# Patient Record
Sex: Female | Born: 1990 | Race: White | Hispanic: No | Smoking: Current some day smoker
Health system: Southern US, Community
[De-identification: ages and names within clinical notes are randomized; demographics above are authoritative.]

## PROBLEM LIST (undated history)

## (undated) ENCOUNTER — Inpatient Hospital Stay (HOSPITAL_COMMUNITY): Payer: Self-pay

## (undated) DIAGNOSIS — K8020 Calculus of gallbladder without cholecystitis without obstruction: Secondary | ICD-10-CM

## (undated) DIAGNOSIS — J45909 Unspecified asthma, uncomplicated: Secondary | ICD-10-CM

## (undated) DIAGNOSIS — F419 Anxiety disorder, unspecified: Secondary | ICD-10-CM

## (undated) DIAGNOSIS — F909 Attention-deficit hyperactivity disorder, unspecified type: Secondary | ICD-10-CM

## (undated) DIAGNOSIS — K219 Gastro-esophageal reflux disease without esophagitis: Secondary | ICD-10-CM

## (undated) DIAGNOSIS — F32A Depression, unspecified: Secondary | ICD-10-CM

## (undated) DIAGNOSIS — I10 Essential (primary) hypertension: Secondary | ICD-10-CM

## (undated) DIAGNOSIS — F329 Major depressive disorder, single episode, unspecified: Secondary | ICD-10-CM

## (undated) HISTORY — PX: TONSILLECTOMY: SUR1361

## (undated) HISTORY — PX: WISDOM TOOTH EXTRACTION: SHX21

---
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
## 2011-01-05 ENCOUNTER — Emergency Department (HOSPITAL_COMMUNITY)
Admission: EM | Admit: 2011-01-05 | Discharge: 2011-01-05 | Disposition: A | Discharge status: Home / Self Care | Payer: Medicaid Other | Attending: Emergency Medicine | Admitting: Emergency Medicine

## 2011-01-05 DIAGNOSIS — J029 Acute pharyngitis, unspecified: Secondary | ICD-10-CM | POA: Insufficient documentation

## 2011-01-05 DIAGNOSIS — F17200 Nicotine dependence, unspecified, uncomplicated: Secondary | ICD-10-CM | POA: Insufficient documentation

## 2011-01-05 DIAGNOSIS — R220 Localized swelling, mass and lump, head: Secondary | ICD-10-CM | POA: Insufficient documentation

## 2011-01-05 LAB — RAPID STREP SCREEN (MED CTR MEBANE ONLY): Streptococcus, Group A Screen (Direct): NEGATIVE

## 2011-01-05 MED ORDER — IBUPROFEN 600 MG PO TABS: 600.0000 mg | ORAL_TABLET | Freq: Four times a day (QID) | ORAL | Status: AC | PRN

## 2011-01-05 MED ORDER — IBUPROFEN 200 MG PO TABS
600.0000 mg | ORAL_TABLET | Freq: Once | ORAL | Status: DC
  Filled 2011-01-05: qty 3

## 2011-01-05 NOTE — ED Notes (Signed)
Pt presents with 1 month h/o difficulty swallowing.  Pt reports glands are swollen, -fever

## 2011-01-05 NOTE — ED Provider Notes (Signed)
History     CSN: [id number]  Arrival date & time 01/05/11  4098   First MD Initiated Contact with Patient 01/05/11 2151      Chief Complaint  Patient presents with  . Sore Throat    (Consider location/radiation/quality/duration/timing/severity/associated sxs/prior treatment) HPI history from patient Patient making a female presents with sore throat. It has been present on and off for one month. It is mild to moderate in severity. It is worse on the right side of the upper present bilaterally. She has had no cough. She had mild subjective fever at one point but is afebrile now. She is tolerating by mouth well and having no respiratory complaints. She feels like her neck may be mildly swollen. She has full range of motion of her neck without trouble. Overall severity mild to moderate. Patient states that she came here today because she wants her tonsils removed.  History reviewed. No pertinent past medical history.  History reviewed. No pertinent past surgical history.  History reviewed. No pertinent family history.  History  Substance Use Topics  . Smoking status: Current Everyday Smoker -- 0.5 packs/day  . Smokeless tobacco: Not on file  . Alcohol Use: No    OB History    Grav Para Term Preterm Abortions TAB SAB Ect Mult Living                  Review of Systems  Constitutional: Negative for fever and chills.  HENT: Negative for facial swelling, neck pain and neck stiffness.   Eyes: Negative for visual disturbance.  Respiratory: Negative for cough, chest tightness, shortness of breath and wheezing.   Cardiovascular: Negative for chest pain.  Gastrointestinal: Negative for nausea, vomiting, abdominal pain and diarrhea.  Genitourinary: Negative for difficulty urinating.  Skin: Negative for rash.  Neurological: Negative for weakness and numbness.  Psychiatric/Behavioral: Negative for behavioral problems and confusion.  All other systems reviewed and are  negative.    Allergies  Review of patient's allergies indicates no known allergies.  Home Medications   Current Outpatient Rx  Name Route Sig Dispense Refill  . ACETAMINOPHEN 500 MG PO TABS Oral Take 1,000 mg by mouth every 6 (six) hours as needed. For pain     . IBUPROFEN 600 MG PO TABS Oral Take 1 tablet (600 mg total) by mouth every 6 (six) hours as needed for pain. 30 tablet 0    BP 124/81  Pulse 110  Temp(Src) 97.7 F (36.5 C) (Oral)  Resp 18  Ht 5\' 2"  (1.575 m)  Wt 135 lb (61.236 kg)  BMI 24.69 kg/m2  SpO2 98%  LMP 11/05/2010  Physical Exam  Nursing note and vitals reviewed. Constitutional: She is oriented to person, place, and time. She appears well-developed and well-nourished. No distress.  HENT:  Head: Normocephalic.  Nose: Nose normal.  Mouth/Throat: Oropharynx is clear and moist.       Minimal pharyngeal erythema. Tonsillar hypertrophy right greater than left. Patent airway. No significant exudate. No significant lymphadenopathy. Full neck range of motion. No soft palate asymmetry.  Eyes: EOM are normal. Pupils are equal, round, and reactive to light. No scleral icterus.  Neck: Normal range of motion. Neck supple.       No bruits  Cardiovascular: Normal rate, regular rhythm and intact distal pulses.   Pulmonary/Chest: Effort normal and breath sounds normal. No respiratory distress. She has no wheezes.  Abdominal: Soft. She exhibits no distension.  Musculoskeletal: Normal range of motion. She exhibits no edema and no  tenderness.  Neurological: She is alert and oriented to person, place, and time. She has normal strength. No cranial nerve deficit or sensory deficit. She exhibits normal muscle tone. Coordination normal. GCS eye subscore is 4. GCS verbal subscore is 5. GCS motor subscore is 6.       No pronator drift  Skin: Skin is warm and dry. No rash noted. She is not diaphoretic.  Psychiatric: She has a normal mood and affect. Her behavior is normal. Thought  content normal.    ED Course  Procedures (including critical care time)   Labs Reviewed  RAPID STREP SCREEN   No results found.   1. Acute viral pharyngitis       MDM   Patient here with intermittent sore throat over the last month. She is here explicitly requesting tonsillectomy. She states that a doctor yesterday told her she had strep throat but did not prescribe antibiotics. Rapid strep. Negative. Patient does have tonsillar hypertrophy but no airway compromise. There is also no evidence of deep space infection. Patient is well-appearing. I provided ENT followup information. Return cautions discussed        Milus Glazier 01/06/11 1610

## 2011-01-05 NOTE — ED Notes (Signed)
Pt declined ibuprofen. States she has ibuprofen in her car. States "I just want my discharge papers."

## 2011-01-11 ENCOUNTER — Other Ambulatory Visit: Payer: Self-pay | Admitting: Otolaryngology

## 2011-01-13 NOTE — ED Provider Notes (Signed)
I saw and evaluated the patient, reviewed the resident's note and I agree with the findings and plan.  Pt with sore throat. No evidence of deep space infection on exam. Plan symptomatic tx. Pt requesting ENT f/u and provided.    Raeford Razor, MD 01/13/11 [id number]

## 2011-12-21 ENCOUNTER — Emergency Department (HOSPITAL_COMMUNITY)
Admission: EM | Admit: 2011-12-21 | Discharge: 2011-12-21 | Disposition: A | Discharge status: Home / Self Care | Payer: Self-pay | Attending: Emergency Medicine | Admitting: Emergency Medicine

## 2011-12-21 ENCOUNTER — Emergency Department (HOSPITAL_COMMUNITY): Payer: Self-pay

## 2011-12-21 ENCOUNTER — Encounter (HOSPITAL_COMMUNITY): Payer: Self-pay | Admitting: Emergency Medicine

## 2011-12-21 DIAGNOSIS — N760 Acute vaginitis: Secondary | ICD-10-CM

## 2011-12-21 DIAGNOSIS — N739 Female pelvic inflammatory disease, unspecified: Secondary | ICD-10-CM | POA: Insufficient documentation

## 2011-12-21 DIAGNOSIS — N730 Acute parametritis and pelvic cellulitis: Secondary | ICD-10-CM

## 2011-12-21 DIAGNOSIS — R630 Anorexia: Secondary | ICD-10-CM | POA: Insufficient documentation

## 2011-12-21 DIAGNOSIS — F17200 Nicotine dependence, unspecified, uncomplicated: Secondary | ICD-10-CM | POA: Insufficient documentation

## 2011-12-21 DIAGNOSIS — R197 Diarrhea, unspecified: Secondary | ICD-10-CM | POA: Insufficient documentation

## 2011-12-21 DIAGNOSIS — Z3202 Encounter for pregnancy test, result negative: Secondary | ICD-10-CM | POA: Insufficient documentation

## 2011-12-21 DIAGNOSIS — R112 Nausea with vomiting, unspecified: Secondary | ICD-10-CM | POA: Insufficient documentation

## 2011-12-21 LAB — CBC WITH DIFFERENTIAL/PLATELET
Basophils Absolute: 0 10*3/uL (ref 0.0–0.1)
Basophils Relative: 0 % (ref 0–1)
Eosinophils Absolute: 0.1 10*3/uL (ref 0.0–0.7)
HCT: 39.3 % (ref 36.0–46.0)
MCH: 28.5 pg (ref 26.0–34.0)
MCHC: 33.6 g/dL (ref 30.0–36.0)
Monocytes Absolute: 0.6 10*3/uL (ref 0.1–1.0)
Neutro Abs: 4 10*3/uL (ref 1.7–7.7)
RDW: 12.4 % (ref 11.5–15.5)

## 2011-12-21 LAB — URINALYSIS, ROUTINE W REFLEX MICROSCOPIC
Protein, ur: NEGATIVE mg/dL
Urobilinogen, UA: 0.2 mg/dL (ref 0.0–1.0)

## 2011-12-21 LAB — BASIC METABOLIC PANEL
Calcium: 8.9 mg/dL (ref 8.4–10.5)
Chloride: 107 mEq/L (ref 96–112)
Creatinine, Ser: 0.58 mg/dL (ref 0.50–1.10)
GFR calc Af Amer: >90 mL/min (ref >90)
GFR calc non Af Amer: >90 mL/min (ref >90)

## 2011-12-21 LAB — POCT PREGNANCY, URINE: Preg Test, Ur: NEGATIVE

## 2011-12-21 LAB — WET PREP, GENITAL: Yeast Wet Prep HPF POC: NONE SEEN

## 2011-12-21 LAB — URINE MICROSCOPIC-ADD ON

## 2011-12-21 MED ORDER — ONDANSETRON HCL 4 MG/2ML IJ SOLN
4.0000 mg | Freq: Once | INTRAMUSCULAR | Status: AC
  Administered 2011-12-21: 4 mg via INTRAVENOUS
  Filled 2011-12-21: qty 2

## 2011-12-21 MED ORDER — CEFTRIAXONE SODIUM 250 MG IJ SOLR
250.0000 mg | Freq: Once | INTRAMUSCULAR | Status: DC
  Filled 2011-12-21: qty 250

## 2011-12-21 MED ORDER — AZITHROMYCIN 250 MG PO TABS
1000.0000 mg | ORAL_TABLET | Freq: Once | ORAL | Status: AC
  Administered 2011-12-21: 1000 mg via ORAL

## 2011-12-21 MED ORDER — AZITHROMYCIN 250 MG PO TABS
1000.0000 mg | ORAL_TABLET | Freq: Once | ORAL | Status: DC
  Filled 2011-12-21: qty 4

## 2011-12-21 MED ORDER — CEFTRIAXONE SODIUM 250 MG IJ SOLR
250.0000 mg | Freq: Once | INTRAMUSCULAR | Status: AC
  Administered 2011-12-21: 250 mg via INTRAMUSCULAR

## 2011-12-21 MED ORDER — MORPHINE SULFATE 4 MG/ML IJ SOLN
4.0000 mg | Freq: Once | INTRAMUSCULAR | Status: AC
  Administered 2011-12-21: 4 mg via INTRAVENOUS
  Filled 2011-12-21: qty 1

## 2011-12-21 MED ORDER — HYDROCODONE-ACETAMINOPHEN 5-325 MG PO TABS: 2.0000 | ORAL_TABLET | ORAL | Status: DC | PRN

## 2011-12-21 MED ORDER — METRONIDAZOLE 500 MG PO TABS: 500.0000 mg | ORAL_TABLET | Freq: Two times a day (BID) | ORAL | Status: DC

## 2011-12-21 MED ORDER — IOHEXOL 300 MG/ML  SOLN
20.0000 mL | INTRAMUSCULAR | Status: AC
  Administered 2011-12-21: 20 mL via ORAL

## 2011-12-21 MED ORDER — SODIUM CHLORIDE 0.9 % IV BOLUS (SEPSIS)
1000.0000 mL | Freq: Once | INTRAVENOUS | Status: AC
  Administered 2011-12-21: 1000 mL via INTRAVENOUS

## 2011-12-21 MED ORDER — LIDOCAINE HCL (PF) 1 % IJ SOLN
INTRAMUSCULAR | Status: AC
  Administered 2011-12-21: 1 mL
  Filled 2011-12-21: qty 5

## 2011-12-21 MED ORDER — METRONIDAZOLE 500 MG PO TABS
500.0000 mg | ORAL_TABLET | Freq: Once | ORAL | Status: AC
  Administered 2011-12-21: 500 mg via ORAL
  Filled 2011-12-21: qty 1

## 2011-12-21 MED ORDER — DOXYCYCLINE HYCLATE 100 MG PO CAPS: 100.0000 mg | ORAL_CAPSULE | Freq: Two times a day (BID) | ORAL | Status: DC

## 2011-12-21 MED ORDER — HYDROMORPHONE HCL PF 1 MG/ML IJ SOLN
0.5000 mg | Freq: Once | INTRAMUSCULAR | Status: AC
  Administered 2011-12-21: 0.5 mg via INTRAVENOUS
  Filled 2011-12-21: qty 1

## 2011-12-21 MED ORDER — IOHEXOL 300 MG/ML  SOLN
80.0000 mL | Freq: Once | INTRAMUSCULAR | Status: AC | PRN
  Administered 2011-12-21: 80 mL via INTRAVENOUS

## 2011-12-21 NOTE — ED Provider Notes (Signed)
History     CSN: [id number]  Arrival date & time 12/21/11  1527   First MD Initiated Contact with Patient 12/21/11 1806      Chief Complaint  Patient presents with  . Abdominal Pain    (Consider location/radiation/quality/duration/timing/severity/associated sxs/prior treatment) HPI  21 year old female with no significant medical history presents complaining of abdominal pain. Patient reports she developed acute onset of sharp stabbing pain to the periumbilical region this a.m. pain is initially intense, rated at 10 out of 10 and has improved to 4/10 throughout the day after taking Tylenol. Patient also endorsed for nausea, vomit twice a food product and has had 2 bouts of nonbloody non-mucousy diarrhea. Pain is worsened with palpation, and nonradiating. Chest decreased appetite. She denies fever, chills, chest pain, shortness of breath, back pain, dysuria, hematuria, vaginal discharge, or rash. No prior abdominal surgery. Appendix is intact. Patient has Mirena IUD, unable to recall her last menstruation.  Pt is sexually active with boyfriend, no protection.    History reviewed. No pertinent past medical history.  History reviewed. No pertinent past surgical history.  History reviewed. No pertinent family history.  History  Substance Use Topics  . Smoking status: Current Every Day Smoker -- 0.5 packs/day  . Smokeless tobacco: Not on file  . Alcohol Use: No    OB History    Grav Para Term Preterm Abortions TAB SAB Ect Mult Living                  Review of Systems  Constitutional: Negative for fever.  Respiratory: Negative for chest tightness and shortness of breath.   Cardiovascular: Negative for chest pain.  Gastrointestinal: Positive for nausea, vomiting, abdominal pain and diarrhea.    Allergies  Review of patient's allergies indicates no known allergies.  Home Medications   Current Outpatient Rx  Name  Route  Sig  Dispense  Refill  . ACETAMINOPHEN 500 MG PO  TABS   Oral   Take 1,000 mg by mouth every 6 (six) hours as needed. For pain            BP 112/61  Pulse 82  Temp 98 F (36.7 C) (Oral)  Resp 16  SpO2 98%  Physical Exam  Nursing note and vitals reviewed. Constitutional: She is oriented to person, place, and time. She appears well-developed and well-nourished. No distress.  HENT:  Head: Normocephalic and atraumatic.  Mouth/Throat: Oropharynx is clear and moist.       Poor dentition  Eyes: Conjunctivae normal are normal.  Neck: Normal range of motion. Neck supple.  Cardiovascular: Normal rate and regular rhythm.  Exam reveals no gallop and no friction rub.   No murmur heard. Pulmonary/Chest: Effort normal. No respiratory distress. She exhibits no tenderness.  Abdominal: Soft. She exhibits no mass. There is tenderness (Diffuse abdominal tenderness, most significant at the periumbilical region without guarding or rebound tenderness). There is no rebound and no guarding.  Genitourinary: Vaginal discharge found.       Chaperone present:  Moderate vaginal discharge in vaginal vault.  Unable to visualized cervical os despite multiple attempt to reposition speculum.  Pt has L and R adnexal tenderness without fullness.  No fb seen or palpated  Musculoskeletal:       Right arm stump, with congenital deformity  Neurological: She is alert and oriented to person, place, and time.    ED Course  Procedures (including critical care time)  Labs Reviewed  URINALYSIS, ROUTINE W REFLEX MICROSCOPIC - Abnormal; Notable  for the following:    APPearance CLOUDY (*)     Hgb urine dipstick SMALL (*)     Leukocytes, UA LARGE (*)     All other components within normal limits  URINE MICROSCOPIC-ADD ON - Abnormal; Notable for the following:    Squamous Epithelial / LPF MANY (*)     Bacteria, UA FEW (*)     All other components within normal limits  POCT PREGNANCY, URINE  URINE CULTURE   Results for orders placed during the hospital encounter of  12/21/11  URINALYSIS, ROUTINE W REFLEX MICROSCOPIC      Component Value Range   Color, Urine YELLOW  YELLOW   APPearance CLOUDY (*) CLEAR   Specific Gravity, Urine 1.019  1.005 - 1.030   pH 6.0  5.0 - 8.0   Glucose, UA NEGATIVE  NEGATIVE mg/dL   Hgb urine dipstick SMALL (*) NEGATIVE   Bilirubin Urine NEGATIVE  NEGATIVE   Ketones, ur NEGATIVE  NEGATIVE mg/dL   Protein, ur NEGATIVE  NEGATIVE mg/dL   Urobilinogen, UA 0.2  0.0 - 1.0 mg/dL   Nitrite NEGATIVE  NEGATIVE   Leukocytes, UA LARGE (*) NEGATIVE  POCT PREGNANCY, URINE      Component Value Range   Preg Test, Ur NEGATIVE  NEGATIVE  URINE MICROSCOPIC-ADD ON      Component Value Range   Squamous Epithelial / LPF MANY (*) RARE   WBC, UA 3-6  <3 WBC/hpf   RBC / HPF 3-6  <3 RBC/hpf   Bacteria, UA FEW (*) RARE   Urine-Other MUCOUS PRESENT    CBC WITH DIFFERENTIAL      Component Value Range   WBC 8.2  4.0 - 10.5 K/uL   RBC 4.63  3.87 - 5.11 MIL/uL   Hemoglobin 13.2  12.0 - 15.0 g/dL   HCT 19.1  47.8 - 29.5 %   MCV 84.9  78.0 - 100.0 fL   MCH 28.5  26.0 - 34.0 pg   MCHC 33.6  30.0 - 36.0 g/dL   RDW 62.1  30.8 - 65.7 %   Platelets 303  150 - 400 K/uL   Neutrophils Relative 49  43 - 77 %   Neutro Abs 4.0  1.7 - 7.7 K/uL   Lymphocytes Relative 42  12 - 46 %   Lymphs Abs 3.4  0.7 - 4.0 K/uL   Monocytes Relative 8  3 - 12 %   Monocytes Absolute 0.6  0.1 - 1.0 K/uL   Eosinophils Relative 1  0 - 5 %   Eosinophils Absolute 0.1  0.0 - 0.7 K/uL   Basophils Relative 0  0 - 1 %   Basophils Absolute 0.0  0.0 - 0.1 K/uL  BASIC METABOLIC PANEL      Component Value Range   Sodium 140  135 - 145 mEq/L   Potassium 4.2  3.5 - 5.1 mEq/L   Chloride 107  96 - 112 mEq/L   CO2 25  19 - 32 mEq/L   Glucose, Bld 86  70 - 99 mg/dL   BUN 12  6 - 23 mg/dL   Creatinine, Ser 8.46  0.50 - 1.10 mg/dL   Calcium 8.9  8.4 - 96.2 mg/dL   GFR calc non Af Amer >90  >90 mL/min   GFR calc Af Amer >90  >90 mL/min  WET PREP, GENITAL      Component Value  Range   Yeast Wet Prep HPF POC NONE SEEN  NONE SEEN   Trich,  Wet Prep NONE SEEN  NONE SEEN   Clue Cells Wet Prep HPF POC MODERATE (*) NONE SEEN   WBC, Wet Prep HPF POC MANY (*) NONE SEEN   US Transvaginal Non-ob  12/21/2011  *RADIOLOGY REPORT*  Clinical Data:  Pelvic pain.  Patient has intrauterine device.  TRANSABDOMINAL AND TRANSVAGINAL ULTRASOUND OF PELVIS DOPPLER ULTRASOUND OF OVARIES  Technique:  Both transabdominal and transvaginal ultrasound examinations of the pelvis were performed. Transabdominal technique was performed for global imaging of the pelvis including uterus, ovaries, adnexal regions, and pelvic cul-de-sac.  It was necessary to proceed with endovaginal exam following the transabdominal exam to visualize the ovaries and endometrium.  Color and duplex Doppler ultrasound was utilized to evaluate blood flow to the ovaries.  Comparison:  None.  Findings:  Uterus:  Normal in size and appearance.  Anteverted and measures 826 x 3.7 x 5.2 cm.  Endometrium:  Normal in thickness and appearance.  Measures 3 mm. An intrauterine device appears appropriately positioned within the endometrial canal.  Right ovary: Normal appearance/no adnexal mass.  Right ovary measures 2.9 x 2.5 x 3.2 cm.  Left ovary:   Normal appearance/no adnexal mass.  Left ovary measures 3.5 x 1.6 x 2.5 cm.  Pulsed Doppler evaluation demonstrates normal low-resistance arterial and venous waveforms in both ovaries.  There is a small amount of simple free fluid.  IMPRESSION:  1.Normal exam.  No evidence of pelvic mass or other significant abnormality.No sonographic evidence for ovarian torsion. 2.  Satisfactory position of intrauterine device.   Original Report Authenticated By: Britta Mccreedy, M.D.    US Pelvis Complete  12/21/2011  *RADIOLOGY REPORT*  Clinical Data:  Pelvic pain.  Patient has intrauterine device.  TRANSABDOMINAL AND TRANSVAGINAL ULTRASOUND OF PELVIS DOPPLER ULTRASOUND OF OVARIES  Technique:  Both transabdominal and  transvaginal ultrasound examinations of the pelvis were performed. Transabdominal technique was performed for global imaging of the pelvis including uterus, ovaries, adnexal regions, and pelvic cul-de-sac.  It was necessary to proceed with endovaginal exam following the transabdominal exam to visualize the ovaries and endometrium.  Color and duplex Doppler ultrasound was utilized to evaluate blood flow to the ovaries.  Comparison:  None.  Findings:  Uterus:  Normal in size and appearance.  Anteverted and measures 826 x 3.7 x 5.2 cm.  Endometrium:  Normal in thickness and appearance.  Measures 3 mm. An intrauterine device appears appropriately positioned within the endometrial canal.  Right ovary: Normal appearance/no adnexal mass.  Right ovary measures 2.9 x 2.5 x 3.2 cm.  Left ovary:   Normal appearance/no adnexal mass.  Left ovary measures 3.5 x 1.6 x 2.5 cm.  Pulsed Doppler evaluation demonstrates normal low-resistance arterial and venous waveforms in both ovaries.  There is a small amount of simple free fluid.  IMPRESSION:  1.Normal exam.  No evidence of pelvic mass or other significant abnormality.No sonographic evidence for ovarian torsion. 2.  Satisfactory position of intrauterine device.   Original Report Authenticated By: Britta Mccreedy, M.D.    Ct Abdomen Pelvis W Contrast  12/21/2011  *RADIOLOGY REPORT*  Clinical Data: Right lower quadrant abdominal pain  CT ABDOMEN AND PELVIS WITH CONTRAST  Technique:  Multidetector CT imaging of the abdomen and pelvis was performed following the standard protocol during bolus administration of intravenous contrast.  Contrast: 80mL OMNIPAQUE IOHEXOL 300 MG/ML  SOLN  Comparison: None.  Findings: Mild linear scarring versus atelectasis in the bilateral lung bases.  Liver, spleen, pancreas, and adrenal glands are within normal limits.  Gallbladder  is unremarkable.  No intrahepatic or extrahepatic ductal dilatation.  Kidneys are within normal limits.  No hydronephrosis.   No evidence of bowel obstruction.  Normal appendix.  No evidence of abdominal aortic aneurysm.  Small retroperitoneal lymph nodes which do not meet pathologic CT size criteria.  Uterus is notable for an IUD in satisfactory position.  Left ovary is unremarkable.  Right ovary is notable for a 1.8 cm corpus luteal cyst.  Small volume pelvic ascites, likely physiologic.  Very mild right pelvic mesenteric stranding (series 2/image 67), correlate for PID.  Bladder is within normal limits.  Visualized osseous structures are within normal limits.  IMPRESSION: Normal appendix.  No evidence of bowel obstruction.  Mild right pelvic mesenteric stranding, correlate for PID.  IUD in satisfactory position.  Right corpus luteal cyst.   Original Report Authenticated By: Charline Bills, M.D.    [place] Art/ven Flow Abd Pelv Doppler  12/21/2011  *RADIOLOGY REPORT*  Clinical Data:  Pelvic pain.  Patient has intrauterine device.  TRANSABDOMINAL AND TRANSVAGINAL ULTRASOUND OF PELVIS DOPPLER ULTRASOUND OF OVARIES  Technique:  Both transabdominal and transvaginal ultrasound examinations of the pelvis were performed. Transabdominal technique was performed for global imaging of the pelvis including uterus, ovaries, adnexal regions, and pelvic cul-de-sac.  It was necessary to proceed with endovaginal exam following the transabdominal exam to visualize the ovaries and endometrium.  Color and duplex Doppler ultrasound was utilized to evaluate blood flow to the ovaries.  Comparison:  None.  Findings:  Uterus:  Normal in size and appearance.  Anteverted and measures 826 x 3.7 x 5.2 cm.  Endometrium:  Normal in thickness and appearance.  Measures 3 mm. An intrauterine device appears appropriately positioned within the endometrial canal.  Right ovary: Normal appearance/no adnexal mass.  Right ovary measures 2.9 x 2.5 x 3.2 cm.  Left ovary:   Normal appearance/no adnexal mass.  Left ovary measures 3.5 x 1.6 x 2.5 cm.  Pulsed Doppler evaluation  demonstrates normal low-resistance arterial and venous waveforms in both ovaries.  There is a small amount of simple free fluid.  IMPRESSION:  1.Normal exam.  No evidence of pelvic mass or other significant abnormality.No sonographic evidence for ovarian torsion. 2.  Satisfactory position of intrauterine device.   Original Report Authenticated By: Britta Mccreedy, M.D.      1. PID 2. Bacterial vaginosis   MDM  21 year old female presents with periumbilical abdominal tenderness, reproducible on exam. Workup initiated.  7:18 PM Patient is moderately tender on pelvic exam.  I was unable to fully visualize cervical os despite multiple attempts to reposition speculum. Plan to obtain pelvic ultrasound for further evaluation.    8:41 PM Pelvic and transvaginal US performed revealed no acute finding suggestive of ovarian torsion, tuboovarian abscess or ovarian cyst.  On re-examination , pt is is moderately tender to periumbilical region.  Her WBC is normal and her labs are unremarkable, however i will obtain abd/pelvic CT scan to r/o appendicitis.  Pain medication given.  Pt agrees with plan.    11:02 PM Abdominal and pelvis CTs reveals no evidence of appendicitis. However there is mild right pelvic mesenteric stranding suggestive of PID. Since patient is moderately tender on pelvic exam this correlation is supportive of PID. Patient will be treated with Rocephin and Zithromax he in the ED. No evidence of bacterial vaginosis. Flagyl given.  Patient will be discharge with Flagyl and doxycycline as treatment.  GC/Chlamydia culture sent.    Pt able to tolerates PO, will discharge.  Fayrene Helper, PA-C 12/21/11 2313

## 2011-12-21 NOTE — ED Notes (Signed)
Pt reporting mid lower quad abdominal pain, n/v/d this morning. Pt came in today because of continued abdominal pain since this morning and she needs a work note for missing work [employer]. Pt is ambulatory. 4/10 pain.

## 2011-12-21 NOTE — ED Notes (Signed)
Pt c/o lower abd pain with diarrhea and vaginal pain; pt sts has merena and unsure if could be from that

## 2011-12-22 NOTE — ED Provider Notes (Signed)
Medical screening examination/treatment/procedure(s) were performed by non-physician practitioner and as supervising physician I was immediately available for consultation/collaboration.   Gerhard Munch, MD 12/22/11 2146

## 2011-12-23 LAB — URINE CULTURE

## 2012-01-03 ENCOUNTER — Emergency Department (HOSPITAL_COMMUNITY): Payer: Self-pay

## 2012-01-03 ENCOUNTER — Emergency Department (HOSPITAL_COMMUNITY)
Admission: EM | Admit: 2012-01-03 | Discharge: 2012-01-03 | Disposition: A | Discharge status: Home / Self Care | Payer: Self-pay | Attending: Emergency Medicine | Admitting: Emergency Medicine

## 2012-01-03 ENCOUNTER — Encounter (HOSPITAL_COMMUNITY): Payer: Self-pay | Admitting: *Deleted

## 2012-01-03 DIAGNOSIS — F17200 Nicotine dependence, unspecified, uncomplicated: Secondary | ICD-10-CM | POA: Insufficient documentation

## 2012-01-03 DIAGNOSIS — N760 Acute vaginitis: Secondary | ICD-10-CM | POA: Insufficient documentation

## 2012-01-03 DIAGNOSIS — N390 Urinary tract infection, site not specified: Secondary | ICD-10-CM | POA: Insufficient documentation

## 2012-01-03 DIAGNOSIS — Z3202 Encounter for pregnancy test, result negative: Secondary | ICD-10-CM | POA: Insufficient documentation

## 2012-01-03 LAB — HEPATIC FUNCTION PANEL
ALT: 17 U/L (ref 0–35)
AST: 26 U/L (ref 0–37)
Alkaline Phosphatase: 65 U/L (ref 39–117)
Bilirubin, Direct: <0.1 mg/dL (ref 0.0–0.3)
Total Bilirubin: 0.2 mg/dL — ABNORMAL LOW (ref 0.3–1.2)

## 2012-01-03 LAB — POCT I-STAT, CHEM 8
BUN: 10 mg/dL (ref 6–23)
Calcium, Ion: 1.2 mmol/L (ref 1.12–1.23)
Creatinine, Ser: 0.7 mg/dL (ref 0.50–1.10)
TCO2: 25 mmol/L (ref 0–100)

## 2012-01-03 LAB — CBC WITH DIFFERENTIAL/PLATELET
Basophils Relative: 0 % (ref 0–1)
Eosinophils Absolute: 0.1 10*3/uL (ref 0.0–0.7)
Eosinophils Relative: 1 % (ref 0–5)
Hemoglobin: 14.2 g/dL (ref 12.0–15.0)
Lymphs Abs: 1.5 10*3/uL (ref 0.7–4.0)
MCH: 28.2 pg (ref 26.0–34.0)
MCHC: 33.5 g/dL (ref 30.0–36.0)
MCV: 84.1 fL (ref 78.0–100.0)
Monocytes Relative: 14 % — ABNORMAL HIGH (ref 3–12)
Platelets: 287 10*3/uL (ref 150–400)
RBC: 5.04 MIL/uL (ref 3.87–5.11)

## 2012-01-03 LAB — URINALYSIS, ROUTINE W REFLEX MICROSCOPIC
Bilirubin Urine: NEGATIVE
Ketones, ur: NEGATIVE mg/dL
Specific Gravity, Urine: 1.025 (ref 1.005–1.030)
Urobilinogen, UA: 0.2 mg/dL (ref 0.0–1.0)

## 2012-01-03 MED ORDER — SULFAMETHOXAZOLE-TMP DS 800-160 MG PO TABS
1.0000 | ORAL_TABLET | Freq: Once | ORAL | Status: AC
  Administered 2012-01-03: 1 via ORAL
  Filled 2012-01-03: qty 1

## 2012-01-03 MED ORDER — SODIUM CHLORIDE 0.9 % IV BOLUS (SEPSIS)
1000.0000 mL | Freq: Once | INTRAVENOUS | Status: AC
  Administered 2012-01-03: 1000 mL via INTRAVENOUS

## 2012-01-03 MED ORDER — OXYCODONE-ACETAMINOPHEN 5-325 MG PO TABS
1.0000 | ORAL_TABLET | Freq: Once | ORAL | Status: AC
  Administered 2012-01-03: 1 via ORAL
  Filled 2012-01-03: qty 1

## 2012-01-03 MED ORDER — ONDANSETRON HCL 4 MG/2ML IJ SOLN
4.0000 mg | Freq: Once | INTRAMUSCULAR | Status: AC
  Administered 2012-01-03: 4 mg via INTRAVENOUS
  Filled 2012-01-03: qty 2

## 2012-01-03 MED ORDER — SULFAMETHOXAZOLE-TRIMETHOPRIM 800-160 MG PO TABS: 1.0000 | ORAL_TABLET | Freq: Two times a day (BID) | ORAL | Status: DC

## 2012-01-03 MED ORDER — TRAMADOL HCL 50 MG PO TABS: 50.0000 mg | ORAL_TABLET | Freq: Four times a day (QID) | ORAL | Status: DC | PRN

## 2012-01-03 MED ORDER — MORPHINE SULFATE 4 MG/ML IJ SOLN
4.0000 mg | Freq: Once | INTRAMUSCULAR | Status: AC
  Administered 2012-01-03: 4 mg via INTRAVENOUS
  Filled 2012-01-03: qty 1

## 2012-01-03 NOTE — ED Notes (Signed)
Pt placed in gown, placed on spo2, bp monitor. Pt given warm blanket

## 2012-01-03 NOTE — ED Notes (Addendum)
To ED for eval of abd pain for past 2 days, more on right. Denies vomiting. Denies vaginal discharge. No diarrhea.

## 2012-01-03 NOTE — ED Provider Notes (Signed)
History     CSN: [id number]  Arrival date & time 01/03/12  1354   First MD Initiated Contact with Patient 01/03/12 1547      Chief Complaint  Patient presents with  . Abdominal Pain    (Consider location/radiation/quality/duration/timing/severity/associated sxs/prior treatment) HPI  Pt to the ED with complaints of RUQ pain that started yesterday. She was seen in the ER  On 12/20 for periumbilical pain and diagnosed with bacterial vaginosis and Chlamydia with mild PID. She has been taking her antibiotics as prescribed and has finished her flagyl and only has a few days left of her doxy. She says that the pain has been constant and is sharp. She has not had fevers, chills, vomiting. She denies having vaginal discharge. She feels as though her urine output has been decreased since starting the doxycycline. She denies having any constipation or previous abdominal surgeries. nad vss  History reviewed. No pertinent past medical history.  History reviewed. No pertinent past surgical history.  History reviewed. No pertinent family history.  History  Substance Use Topics  . Smoking status: Current Every Day Smoker -- 0.5 packs/day  . Smokeless tobacco: Not on file  . Alcohol Use: No    OB History    Grav Para Term Preterm Abortions TAB SAB Ect Mult Living                  Review of Systems  Review of Systems  Gen: no weight loss, fevers, chills, night sweats  Eyes: no discharge or drainage, no occular pain or visual changes  Nose: no epistaxis or rhinorrhea  Mouth: no dental pain, no sore throat  Neck: no neck pain  Lungs:No wheezing, coughing or hemoptysis CV: no chest pain, palpitations, dependent edema or orthopnea  Abd: RUQ abdominal pain, nausea, no  vomiting  GU: no dysuria or gross hematuria  MSK:  No abnormalities  Neuro: no headache, no focal neurologic deficits  Skin: no abnormalities Psyche: negative.   Allergies  Review of patient's allergies indicates no  known allergies.  Home Medications   Current Outpatient Rx  Name  Route  Sig  Dispense  Refill  . DOXYCYCLINE HYCLATE 100 MG PO CAPS   Oral   Take 1 capsule (100 mg total) by mouth 2 (two) times daily.   28 capsule   0   . SULFAMETHOXAZOLE-TRIMETHOPRIM 800-160 MG PO TABS   Oral   Take 1 tablet by mouth every 12 (twelve) hours.   10 tablet   0     BP 106/61  Pulse 106  Temp 97.8 F (36.6 C) (Oral)  Resp 18  SpO2 100%  Physical Exam  Nursing note and vitals reviewed. Constitutional: She appears well-developed and well-nourished. No distress.  HENT:  Head: Normocephalic and atraumatic.  Eyes: Pupils are equal, round, and reactive to light.  Neck: Normal range of motion. Neck supple.  Cardiovascular: Normal rate and regular rhythm.   Pulmonary/Chest: Effort normal.  Abdominal: Soft. There is tenderness. There is guarding. There is no rigidity, no rebound, no CVA tenderness, no tenderness at McBurney's point and negative Murphy's sign.    Neurological: She is alert.  Skin: Skin is warm and dry.    ED Course  Procedures (including critical care time)  Labs Reviewed  URINALYSIS, ROUTINE W REFLEX MICROSCOPIC - Abnormal; Notable for the following:    APPearance CLOUDY (*)     Hgb urine dipstick LARGE (*)     Leukocytes, UA LARGE (*)  All other components within normal limits  CBC WITH DIFFERENTIAL - Abnormal; Notable for the following:    Monocytes Relative 14 (*)     All other components within normal limits  URINE MICROSCOPIC-ADD ON - Abnormal; Notable for the following:    Squamous Epithelial / LPF MANY (*)     All other components within normal limits  HEPATIC FUNCTION PANEL - Abnormal; Notable for the following:    Total Bilirubin 0.2 (*)     All other components within normal limits  POCT PREGNANCY, URINE  POCT I-STAT, CHEM 8  LIPASE, BLOOD  URINE CULTURE   US Abdomen Complete  01/03/2012  *RADIOLOGY REPORT*  Clinical Data:  Right upper quadrant pain.   ABDOMINAL ULTRASOUND COMPLETE  Comparison:  None.  Findings:  Gallbladder:  No gallstones, gallbladder wall thickening, or pericholecystic fluid.  Common Bile Duct:  Within normal limits in caliber. Measures 5 mm in diameter.  Liver: No focal mass lesion identified.  Within normal limits in parenchymal echogenicity.  IVC:  Appears normal.  Pancreas:  No abnormality identified.  Spleen:  Within normal limits in size and echotexture.  Right kidney:  Normal in size and parenchymal echogenicity.  No evidence of mass or hydronephrosis.  Left kidney:  Normal in size and parenchymal echogenicity.  No evidence of mass or hydronephrosis.  Abdominal Aorta:  No aneurysm identified.  IMPRESSION: Negative abdominal ultrasound.   Original Report Authenticated By: Myles Rosenthal, M.D.      1. UTI (lower urinary tract infection)       MDM  Pt has large UTI. She is almost done with her treatment for her vaginal infections. She says that she is no longer having any vaginal symptoms, discharge. I do not feel that this is fitz hugh curtis syndome because the patient is not sick appear, not having lower abdominal pain, no fevers, pr vomiting or white count.  Will treat pts urinary tract ifnection and have [facility]. Urine culture sent out.  Pt has been advised of the symptoms that warrant their return to the ED. Patient has voiced understanding and has agreed to follow-up with the PCP or specialist.      Dorthula Matas, PA 01/03/12 1820

## 2012-01-03 NOTE — ED Notes (Signed)
Pt still under treatment for STD

## 2012-01-03 NOTE — ED Provider Notes (Signed)
Medical screening examination/treatment/procedure(s) were performed by non-physician practitioner and as supervising physician I was immediately available for consultation/collaboration.   Kyl Givler, MD 01/03/12 2344 

## 2012-01-05 LAB — URINE CULTURE

## 2015-01-20 ENCOUNTER — Encounter (HOSPITAL_COMMUNITY): Payer: Self-pay | Admitting: Emergency Medicine

## 2015-01-20 ENCOUNTER — Other Ambulatory Visit (HOSPITAL_COMMUNITY)
Admission: RE | Admit: 2015-01-20 | Discharge: 2015-01-20 | Disposition: A | Discharge status: Home / Self Care | Payer: Medicaid Other | Source: Ambulatory Visit | Attending: Emergency Medicine | Admitting: Emergency Medicine

## 2015-01-20 ENCOUNTER — Emergency Department (INDEPENDENT_AMBULATORY_CARE_PROVIDER_SITE_OTHER)
Admission: EM | Admit: 2015-01-20 | Discharge: 2015-01-20 | Disposition: A | Discharge status: Home / Self Care | Payer: Self-pay | Source: Home / Self Care | Attending: Emergency Medicine | Admitting: Emergency Medicine

## 2015-01-20 ENCOUNTER — Other Ambulatory Visit (HOSPITAL_COMMUNITY): Admission: AD | Admit: 2015-01-20 | Payer: Self-pay | Source: Ambulatory Visit | Admitting: Emergency Medicine

## 2015-01-20 DIAGNOSIS — R509 Fever, unspecified: Secondary | ICD-10-CM | POA: Diagnosis present

## 2015-01-20 DIAGNOSIS — J3489 Other specified disorders of nose and nasal sinuses: Secondary | ICD-10-CM | POA: Diagnosis not present

## 2015-01-20 DIAGNOSIS — J029 Acute pharyngitis, unspecified: Secondary | ICD-10-CM | POA: Insufficient documentation

## 2015-01-20 DIAGNOSIS — R6889 Other general symptoms and signs: Secondary | ICD-10-CM

## 2015-01-20 LAB — POCT RAPID STREP A: Streptococcus, Group A Screen (Direct): NEGATIVE

## 2015-01-20 MED ORDER — IPRATROPIUM BROMIDE 0.06 % NA SOLN: 2.0000 | Freq: Four times a day (QID) | NASAL | Status: DC

## 2015-01-20 NOTE — ED Provider Notes (Signed)
CSN: [id number]     Arrival date & time 01/20/15  1303 History   First MD Initiated Contact with Patient 01/20/15 1349     Chief Complaint  Patient presents with  . Influenza    sx   (Consider location/radiation/quality/duration/timing/severity/associated sxs/prior Treatment) HPI  She is a 25 year old woman here for evaluation of fever and sore throat. She states the last 2 days she has had nasal congestion, rhinorrhea and sore throat. She also describes some sneezing and a mild cough. She reports a fever of 102.3 last night. No shortness of breath. She denies body aches, but does describe fatigue. Her son was recently sick with strep throat and influenza.  History reviewed. No pertinent past medical history. History reviewed. No pertinent past surgical history. History reviewed. No pertinent family history. Social History  Substance Use Topics  . Smoking status: Current Every Day Smoker -- 0.50 packs/day  . Smokeless tobacco: None  . Alcohol Use: No   OB History    No data available     Review of Systems As in history of present illness Allergies  Review of patient's allergies indicates no known allergies.  Home Medications   Prior to Admission medications   Medication Sig Start Date End Date Taking? Authorizing Provider  doxycycline (VIBRAMYCIN) 100 MG capsule Take 1 capsule (100 mg total) by mouth 2 (two) times daily. 12/21/11   Fayrene Helper, PA-C  ipratropium (ATROVENT) 0.06 % nasal spray Place 2 sprays into both nostrils 4 (four) times daily. 01/20/15   Charm Rings, MD  sulfamethoxazole-trimethoprim (SEPTRA DS) 800-160 MG per tablet Take 1 tablet by mouth every 12 (twelve) hours. 01/03/12   Tiffany Neva Seat, PA-C  traMADol (ULTRAM) 50 MG tablet Take 1 tablet (50 mg total) by mouth every 6 (six) hours as needed for pain. 01/03/12   Marlon Pel, PA-C   Meds Ordered and Administered this Visit  Medications - No data to display  BP 111/66 mmHg  Pulse 95  Temp(Src) 98.3 F  (36.8 C) (Oral)  SpO2 98% No data found.   Physical Exam  Constitutional: She is oriented to person, place, and time. She appears well-developed and well-nourished.  HENT:  Mouth/Throat: No oropharyngeal exudate.  History of tonsillectomy. Oropharynx is slightly erythematous. Nasal discharge present. Nasal mucosa slightly erythematous. TMs normal bilaterally.  Neck: Neck supple.  Cardiovascular: Normal rate, regular rhythm and normal heart sounds.   No murmur heard. Pulmonary/Chest: Effort normal and breath sounds normal. No respiratory distress. She has no wheezes. She has no rales.  Lymphadenopathy:    She has no cervical adenopathy.  Neurological: She is alert and oriented to person, place, and time.    ED Course  Procedures (including critical care time)  Labs Review Labs Reviewed  POCT RAPID STREP A    Imaging Review No results found.    MDM   1. Flu-like symptoms    Rapid strep is negative. She likely has flu or flulike illness from her son. Discussed importance of rest and fluid intake. Work note provided. She may return to work when she has been afebrile for 24 hours. Recommended over-the-counter remedies for sore throat as well as nasal saline spray and OTC allergy medication for nasal congestion. Prescription given for Atrovent for nasal congestion. Follow-up if symptoms change or worsen.    Charm Rings, MD 01/20/15 1434

## 2015-01-20 NOTE — ED Notes (Signed)
C/o flu sx for two days  States she has a runny nose, sore throat, sneezing, and little cough States she is weak otc meds used as tx

## 2015-01-20 NOTE — Discharge Instructions (Signed)
You probably got the flu from your son. Make sure you're drinking plenty of fluids and getting plenty of rest. You can do Cepacol lozenges, Chloraseptic spray, salt water gargles, tea with honey to help with the sore throat. Nasal saline spray and an over-the-counter allergy medicine such as Zyrtec or Claritin will help with the congestion. Atrovent nasal spray will also help with the congestion. With the coupon, this should be about $15 at [facility]. Please stay home until you have been without fever for 24 hours. Follow-up if your symptoms change or worsen.

## 2015-01-23 LAB — CULTURE, GROUP A STREP (THRC)

## 2015-04-07 ENCOUNTER — Encounter (HOSPITAL_COMMUNITY): Payer: Self-pay | Admitting: Emergency Medicine

## 2015-04-07 ENCOUNTER — Ambulatory Visit (HOSPITAL_COMMUNITY)
Admission: EM | Admit: 2015-04-07 | Discharge: 2015-04-07 | Disposition: A | Discharge status: Home / Self Care | Payer: Self-pay | Attending: Family Medicine | Admitting: Family Medicine

## 2015-04-07 DIAGNOSIS — R059 Cough, unspecified: Secondary | ICD-10-CM

## 2015-04-07 DIAGNOSIS — R05 Cough: Secondary | ICD-10-CM

## 2015-04-07 DIAGNOSIS — J40 Bronchitis, not specified as acute or chronic: Secondary | ICD-10-CM

## 2015-04-07 MED ORDER — AZITHROMYCIN 250 MG PO TABS: 250.0000 mg | ORAL_TABLET | Freq: Every day | ORAL | Status: DC

## 2015-04-07 MED ORDER — GUAIFENESIN ER 600 MG PO TB12: 1200.0000 mg | ORAL_TABLET | Freq: Two times a day (BID) | ORAL | Status: DC

## 2015-04-07 MED ORDER — FLUTICASONE PROPIONATE 50 MCG/ACT NA SUSP: 2.0000 | Freq: Every day | NASAL | Status: DC

## 2015-04-07 NOTE — ED Provider Notes (Signed)
CSN: [id number]     Arrival date & time 04/07/15  1259 History   First MD Initiated Contact with Patient 04/07/15 1336     Chief Complaint  Patient presents with  . URI   (Consider location/radiation/quality/duration/timing/severity/associated sxs/prior Treatment) Patient is a 25 y.o. female presenting with URI. The history is provided by the patient. No language interpreter was used.  URI Presenting symptoms: congestion, cough, fatigue, fever and rhinorrhea   Presenting symptoms: no ear pain and no sore throat   Patient presents with complaint of cough paroxysms, chest and nasal congestion, initially presenting with fevers.  Started on March 25th, more with fevers/chills then and then improving before getting worse.  Now with coughing paroxysms that keep her awake. Nasal congestion with some facial pain; green malodorous nasal discharge. No further fevers.  Patient's 98 yr old son recently diagnosed with strep throat.   The patient denies sore throat, active fevers, or ear pain> No nausea or vomiting, no diarrhea. She has chest pain only with forceful coughing. Works in child care.  Smokes 1/3 ppd cigarettes, much less since becoming ill.   Has taken Theraflu and other otc cough/cold preparations wtihout relief.   NKDA  History reviewed. No pertinent past medical history. No past surgical history on file. No family history on file. Social History  Substance Use Topics  . Smoking status: Current Every Day Smoker -- 0.50 packs/day  . Smokeless tobacco: None  . Alcohol Use: No   OB History    No data available     Review of Systems  Constitutional: Positive for fever, chills and fatigue. Negative for appetite change.  HENT: Positive for congestion, rhinorrhea, sinus pressure and voice change. Negative for ear pain, hearing loss, nosebleeds and sore throat.   Respiratory: Positive for cough. Negative for shortness of breath.   Cardiovascular: Negative for chest pain.   Gastrointestinal: Negative for nausea, vomiting, abdominal pain and diarrhea.    Allergies  Review of patient's allergies indicates no known allergies.  Home Medications   Prior to Admission medications   Medication Sig Start Date End Date Taking? Authorizing Provider  azithromycin (ZITHROMAX) 250 MG tablet Take 1 tablet (250 mg total) by mouth daily. Take first 2 tablets together, then 1 every day until finished. 04/07/15   Barbaraann Barthel, MD  doxycycline (VIBRAMYCIN) 100 MG capsule Take 1 capsule (100 mg total) by mouth 2 (two) times daily. 12/21/11   Fayrene Helper, PA-C  fluticasone (FLONASE) 50 MCG/ACT nasal spray Place 2 sprays into both nostrils daily. 04/07/15   Barbaraann Barthel, MD  guaiFENesin (MUCINEX) 600 MG 12 hr tablet Take 2 tablets (1,200 mg total) by mouth 2 (two) times daily. 04/07/15   Barbaraann Barthel, MD  ipratropium (ATROVENT) 0.06 % nasal spray Place 2 sprays into both nostrils 4 (four) times daily. 01/20/15   Charm Rings, MD  sulfamethoxazole-trimethoprim (SEPTRA DS) 800-160 MG per tablet Take 1 tablet by mouth every 12 (twelve) hours. 01/03/12   Tiffany Neva Seat, PA-C  traMADol (ULTRAM) 50 MG tablet Take 1 tablet (50 mg total) by mouth every 6 (six) hours as needed for pain. 01/03/12   Marlon Pel, PA-C   Meds Ordered and Administered this Visit  Medications - No data to display  BP 103/51 mmHg  Pulse 94  Temp(Src) 98.2 F (36.8 C) (Oral)  Resp 16  SpO2 100% No data found.   Physical Exam  Constitutional: She appears well-developed and well-nourished. No distress.  HENT:  Head: Normocephalic and  atraumatic.  Right Ear: External ear normal.  Left Ear: External ear normal.  Mouth/Throat: No oropharyngeal exudate.  Nasal mucosa boggy with copious inspissated discharge.  Bilateral maxillary sinus tenderness present.   Injected sclerae.  Cobblestoning oropharynx without exudate.   Eyes: Right eye exhibits no discharge. Left eye exhibits no discharge.  Neck: Normal range  of motion. Neck supple.  Shotty anterior cervical adenoapthy  Cardiovascular: Normal rate, regular rhythm and normal heart sounds.   No murmur heard. Pulmonary/Chest: Effort normal and breath sounds normal. No respiratory distress. She has no wheezes. She has no rales. She exhibits no tenderness.  Abdominal: Soft.  Skin: She is not diaphoretic.    ED Course  Procedures (including critical care time)  Labs Review Labs Reviewed - No data to display  Imaging Review No results found.   Visual Acuity Review  Right Eye Distance:   Left Eye Distance:   Bilateral Distance:    Right Eye Near:   Left Eye Near:    Bilateral Near:         MDM   1. Cough   2. Bronchitis    Coughing paroxysms, initially improving and then worsening, over 2 weeks. Plan to treat with macrolide for possible atypical organisms; symptom relief with mucolytic, nasal steroids, increased fluids.  Note for work, may return tomorrow if remains afebrile.   Paula ComptonJames Glendene Wyer, MD    Barbaraann BarthelJames O Elantra Caprara, MD 04/07/15 [phone]

## 2015-04-07 NOTE — Discharge Instructions (Signed)
It is a pleasure to see you today.    I am treating you with the following three medications. You may shop for the best price using the www.GoodRx.com app:  1. Azithromycin 250mg  tablets, take 2 tablets by mouth on day one, then 1 tablet daily for 4 days.   2. Guaifenesin (Mucinex) 600mg  tablets, take 1-2 tablets by mouth twice daily to loosen mucus;   3. Flonase nasal spray, 2 sprays per nostril once daily in the morning.  Use nasal saline before applying the Flonase, and gargle with water after using.   Note for work.   Follow up with Urgent Care center if you resume having fevers, if your symptoms get worse, or with other concerns.

## 2015-04-07 NOTE — ED Notes (Signed)
C/o cold sx onset 3/25 Sx include chest d/c when she coughs... Also reports congestion A&O x4... No acute distress.

## 2015-06-01 ENCOUNTER — Encounter (HOSPITAL_COMMUNITY): Payer: Self-pay | Admitting: *Deleted

## 2015-06-01 ENCOUNTER — Emergency Department (HOSPITAL_COMMUNITY): Payer: Self-pay

## 2015-06-01 ENCOUNTER — Emergency Department (HOSPITAL_COMMUNITY)
Admission: EM | Admit: 2015-06-01 | Discharge: 2015-06-01 | Disposition: A | Discharge status: Home / Self Care | Payer: Self-pay | Attending: Emergency Medicine | Admitting: Emergency Medicine

## 2015-06-01 DIAGNOSIS — Z79899 Other long term (current) drug therapy: Secondary | ICD-10-CM | POA: Insufficient documentation

## 2015-06-01 DIAGNOSIS — F17200 Nicotine dependence, unspecified, uncomplicated: Secondary | ICD-10-CM | POA: Insufficient documentation

## 2015-06-01 DIAGNOSIS — Z7951 Long term (current) use of inhaled steroids: Secondary | ICD-10-CM | POA: Insufficient documentation

## 2015-06-01 DIAGNOSIS — Z3202 Encounter for pregnancy test, result negative: Secondary | ICD-10-CM | POA: Insufficient documentation

## 2015-06-01 DIAGNOSIS — R1110 Vomiting, unspecified: Secondary | ICD-10-CM | POA: Insufficient documentation

## 2015-06-01 DIAGNOSIS — M545 Low back pain: Secondary | ICD-10-CM | POA: Insufficient documentation

## 2015-06-01 DIAGNOSIS — R1011 Right upper quadrant pain: Secondary | ICD-10-CM | POA: Insufficient documentation

## 2015-06-01 DIAGNOSIS — R1013 Epigastric pain: Secondary | ICD-10-CM | POA: Insufficient documentation

## 2015-06-01 DIAGNOSIS — Z792 Long term (current) use of antibiotics: Secondary | ICD-10-CM | POA: Insufficient documentation

## 2015-06-01 LAB — COMPREHENSIVE METABOLIC PANEL
ALK PHOS: 54 U/L (ref 38–126)
ALT: 17 U/L (ref 14–54)
ANION GAP: 5 (ref 5–15)
AST: 18 U/L (ref 15–41)
Albumin: 3.8 g/dL (ref 3.5–5.0)
BUN: 6 mg/dL (ref 6–20)
CALCIUM: 8.9 mg/dL (ref 8.9–10.3)
CHLORIDE: 111 mmol/L (ref 101–111)
CO2: 24 mmol/L (ref 22–32)
Creatinine, Ser: 0.59 mg/dL (ref 0.44–1.00)
GFR calc non Af Amer: >60 mL/min (ref >60)
Glucose, Bld: 99 mg/dL (ref 65–99)
Potassium: 4.1 mmol/L (ref 3.5–5.1)
SODIUM: 140 mmol/L (ref 135–145)
Total Bilirubin: 0.6 mg/dL (ref 0.3–1.2)
Total Protein: 6.3 g/dL — ABNORMAL LOW (ref 6.5–8.1)

## 2015-06-01 LAB — URINE MICROSCOPIC-ADD ON: WBC UA: NONE SEEN WBC/hpf (ref 0–5)

## 2015-06-01 LAB — URINALYSIS, ROUTINE W REFLEX MICROSCOPIC
BILIRUBIN URINE: NEGATIVE
GLUCOSE, UA: NEGATIVE mg/dL
KETONES UR: NEGATIVE mg/dL
Leukocytes, UA: NEGATIVE
NITRITE: NEGATIVE
PH: 7 (ref 5.0–8.0)
Protein, ur: NEGATIVE mg/dL
Specific Gravity, Urine: 1.005 (ref 1.005–1.030)

## 2015-06-01 LAB — CBC
HEMATOCRIT: 39.3 % (ref 36.0–46.0)
Hemoglobin: 12.7 g/dL (ref 12.0–15.0)
MCH: 27.9 pg (ref 26.0–34.0)
MCHC: 32.3 g/dL (ref 30.0–36.0)
MCV: 86.4 fL (ref 78.0–100.0)
Platelets: 304 10*3/uL (ref 150–400)
RBC: 4.55 MIL/uL (ref 3.87–5.11)
RDW: 12.5 % (ref 11.5–15.5)
WBC: 6.6 10*3/uL (ref 4.0–10.5)

## 2015-06-01 LAB — POC URINE PREG, ED: Preg Test, Ur: NEGATIVE

## 2015-06-01 LAB — LIPASE, BLOOD: LIPASE: 19 U/L (ref 11–51)

## 2015-06-01 MED ORDER — GI COCKTAIL ~~LOC~~
30.0000 mL | Freq: Once | ORAL | Status: AC
  Administered 2015-06-01: 30 mL via ORAL
  Filled 2015-06-01: qty 30

## 2015-06-01 MED ORDER — FAMOTIDINE 20 MG PO TABS
20.0000 mg | ORAL_TABLET | Freq: Once | ORAL | Status: AC
  Administered 2015-06-01: 20 mg via ORAL
  Filled 2015-06-01: qty 1

## 2015-06-01 MED ORDER — ONDANSETRON 4 MG PO TBDP
8.0000 mg | ORAL_TABLET | Freq: Once | ORAL | Status: AC
  Administered 2015-06-01: 8 mg via ORAL
  Filled 2015-06-01: qty 2

## 2015-06-01 MED ORDER — SUCRALFATE 1 G PO TABS: 1.0000 g | ORAL_TABLET | Freq: Three times a day (TID) | ORAL | Status: DC

## 2015-06-01 MED ORDER — FAMOTIDINE 20 MG PO TABS: 20.0000 mg | ORAL_TABLET | Freq: Two times a day (BID) | ORAL | Status: DC

## 2015-06-01 MED ORDER — KETOROLAC TROMETHAMINE 30 MG/ML IJ SOLN
30.0000 mg | Freq: Once | INTRAMUSCULAR | Status: AC
  Administered 2015-06-01: 30 mg via INTRAMUSCULAR
  Filled 2015-06-01: qty 1

## 2015-06-01 NOTE — ED Notes (Signed)
Wait on IV per PA request.

## 2015-06-01 NOTE — ED Notes (Signed)
Pt remains in ultrasound at this time.

## 2015-06-01 NOTE — ED Notes (Signed)
Attempted to call ultrasound, no answer.

## 2015-06-01 NOTE — Discharge Instructions (Signed)
Start taking Pepcid daily. Take Carafate with meals and at bedtime. Take Tylenol for pain. Avoid any NSAIDs such as ibuprofen, Aleve, Naprosyn. Avoid alcohol or smoking. No spicy foods. Follow-up with your doctor.  Gastritis, Adult Gastritis is soreness and swelling (inflammation) of the lining of the stomach. Gastritis can develop as a sudden onset (acute) or long-term (chronic) condition. If gastritis is not treated, it can lead to stomach bleeding and ulcers. CAUSES  Gastritis occurs when the stomach lining is weak or damaged. Digestive juices from the stomach then inflame the weakened stomach lining. The stomach lining may be weak or damaged due to viral or bacterial infections. One common bacterial infection is the Helicobacter pylori infection. Gastritis can also result from excessive alcohol consumption, taking certain medicines, or having too much acid in the stomach.  SYMPTOMS  In some cases, there are no symptoms. When symptoms are present, they may include:  Pain or a burning sensation in the upper abdomen.  Nausea.  Vomiting.  An uncomfortable feeling of fullness after eating. DIAGNOSIS  Your caregiver may suspect you have gastritis based on your symptoms and a physical exam. To determine the cause of your gastritis, your caregiver may perform the following:  Blood or stool tests to check for the H pylori bacterium.  Gastroscopy. A thin, flexible tube (endoscope) is passed down the esophagus and into the stomach. The endoscope has a light and camera on the end. Your caregiver uses the endoscope to view the inside of the stomach.  Taking a tissue sample (biopsy) from the stomach to examine under a microscope. TREATMENT  Depending on the cause of your gastritis, medicines may be prescribed. If you have a bacterial infection, such as an H pylori infection, antibiotics may be given. If your gastritis is caused by too much acid in the stomach, H2 blockers or antacids may be given.  Your caregiver may recommend that you stop taking aspirin, ibuprofen, or other nonsteroidal anti-inflammatory drugs (NSAIDs). HOME CARE INSTRUCTIONS  Only take over-the-counter or prescription medicines as directed by your caregiver.  If you were given antibiotic medicines, take them as directed. Finish them even if you start to feel better.  Drink enough fluids to keep your urine clear or pale yellow.  Avoid foods and drinks that make your symptoms worse, such as:  Caffeine or alcoholic drinks.  Chocolate.  Peppermint or mint flavorings.  Garlic and onions.  Spicy foods.  Citrus fruits, such as oranges, lemons, or limes.  Tomato-based foods such as sauce, chili, salsa, and pizza.  Fried and fatty foods.  Eat small, frequent meals instead of large meals. SEEK IMMEDIATE MEDICAL CARE IF:   You have black or dark red stools.  You vomit blood or material that looks like coffee grounds.  You are unable to keep fluids down.  Your abdominal pain gets worse.  You have a fever.  You do not feel better after 1 week.  You have any other questions or concerns. MAKE SURE YOU:  Understand these instructions.  Will watch your condition.  Will get help right away if you are not doing well or get worse.   This information is not intended to replace advice given to you by your health care provider. Make sure you discuss any questions you have with your health care provider.   Document Released: 12/13/2000 Document Revised: 06/20/2011 Document Reviewed: 02/01/2011 Elsevier Interactive Patient Education [employer]2016 [employer]

## 2015-06-01 NOTE — ED Notes (Signed)
Patient transported to Ultrasound 

## 2015-06-01 NOTE — ED Provider Notes (Signed)
CSN: [id number]     Arrival date & time 06/01/15  1309 History  By signing my name below, I, Bridgette Habermann, attest that this documentation has been prepared under the direction and in the presence of Kabeer Hoagland, PA-C. Electronically Signed: Bridgette Habermann, ED Scribe. 06/01/2015. 1:56 PM.   Chief Complaint  Patient presents with  . Abdominal Pain  . Emesis    The history is provided by the patient. No language interpreter was used.    HPI Comments: Anna Decker is a 25 y.o. female who presents to the Emergency Department complaining of sudden onset, waxing and waning, epigastric abdominal pain onset three days ago. Pt also reports one episode of emesis today with red appearance. Per pt, her pain was initially sharp 3 days ago, but is now dull. Patient says pain is worsened significantly post-prandial. She has taken Aleve and Ibuprofen with no relief. Patient's last normal bowel movement was yesterday. Patient is a daily smoker, occasional drinker and does not ingest a lot of caffeine. She does note that she consumes spicy foods frequently. Pt states she has back pain at baseline and reports it has not worsened with her current symptoms. Pt states her current pain feels similar to h/o GERD, but more severe. No h/o abdominal surgeries. No concern for pregnancy. Patient denies constipation, diarrhea, dysuria, hematuria, urgency, frequency, increased belching, burning sensation in her throat, and bowel and urinary incontinence.   History reviewed. No pertinent past medical history. Past Surgical History  Procedure Laterality Date  . Tonsillectomy     No family history on file. Social History  Substance Use Topics  . Smoking status: Current Every Day Smoker -- 0.50 packs/day  . Smokeless tobacco: None  . Alcohol Use: No   OB History    No data available     Review of Systems  Respiratory: Negative for cough.   Gastrointestinal: Positive for vomiting and abdominal pain (epigastric).  Negative for diarrhea and constipation.       No bowel or urinary incontinence  Musculoskeletal: Positive for back pain (baseline).      Allergies  Review of patient's allergies indicates no known allergies.  Home Medications   Prior to Admission medications   Medication Sig Start Date End Date Taking? Authorizing Provider  azithromycin (ZITHROMAX) 250 MG tablet Take 1 tablet (250 mg total) by mouth daily. Take first 2 tablets together, then 1 every day until finished. 04/07/15   Barbaraann Barthel, MD  doxycycline (VIBRAMYCIN) 100 MG capsule Take 1 capsule (100 mg total) by mouth 2 (two) times daily. 12/21/11   Fayrene Helper, PA-C  fluticasone (FLONASE) 50 MCG/ACT nasal spray Place 2 sprays into both nostrils daily. 04/07/15   Barbaraann Barthel, MD  guaiFENesin (MUCINEX) 600 MG 12 hr tablet Take 2 tablets (1,200 mg total) by mouth 2 (two) times daily. 04/07/15   Barbaraann Barthel, MD  ipratropium (ATROVENT) 0.06 % nasal spray Place 2 sprays into both nostrils 4 (four) times daily. 01/20/15   Charm Rings, MD  sulfamethoxazole-trimethoprim (SEPTRA DS) 800-160 MG per tablet Take 1 tablet by mouth every 12 (twelve) hours. 01/03/12   Tiffany Neva Seat, PA-C  traMADol (ULTRAM) 50 MG tablet Take 1 tablet (50 mg total) by mouth every 6 (six) hours as needed for pain. 01/03/12   Tiffany Neva Seat, PA-C   BP 119/76 mmHg  Pulse 92  Temp(Src) 98 F (36.7 C) (Oral)  Resp 18  Wt 160 lb (72.576 kg)  SpO2 98% Physical Exam  Constitutional: She is oriented to person, place, and time. She appears well-developed and well-nourished.  HENT:  Head: Normocephalic.  Eyes: Conjunctivae are normal.  Cardiovascular: Normal rate.   Pulmonary/Chest: Effort normal. No respiratory distress.  Abdominal: Soft. Bowel sounds are normal. She exhibits no distension. There is tenderness. There is no rebound and no guarding.  Gastric and right upper quadrant tenderness, no guarding  Musculoskeletal: Normal range of motion.  Neurological: She is  alert and oriented to person, place, and time.  Skin: Skin is warm and dry.  Psychiatric: She has a normal mood and affect. Her behavior is normal.  Nursing note and vitals reviewed.   ED Course  Procedures (including critical care time) DIAGNOSTIC STUDIES: Oxygen Saturation is 98% on RA, normal by my interpretation.    COORDINATION OF CARE: 1:54 PM Discussed treatment plan with pt at bedside which includes blood work and pt agreed to plan.   Labs Review Labs Reviewed  COMPREHENSIVE METABOLIC PANEL - Abnormal; Notable for the following:    Total Protein 6.3 (*)    All other components within normal limits  URINALYSIS, ROUTINE W REFLEX MICROSCOPIC (NOT AT [facility]) - Abnormal; Notable for the following:    Hgb urine dipstick SMALL (*)    All other components within normal limits  URINE MICROSCOPIC-ADD ON - Abnormal; Notable for the following:    Squamous Epithelial / LPF 0-5 (*)    Bacteria, UA FEW (*)    All other components within normal limits  LIPASE, BLOOD  CBC  POC URINE PREG, ED    Imaging Review [place] Abdomen Complete  06/01/2015  CLINICAL DATA:  Abdominal pain for 3 days, vomiting for 1 day. EXAM: ABDOMEN ULTRASOUND COMPLETE COMPARISON:  None. FINDINGS: Gallbladder: No gallstones seen. There is no gallbladder wall thickening, pericholecystic fluid or other secondary sonographic signs of acute cholecystitis. No sonographic Murphy's sign elicited during the examination, per the sonographer. Common bile duct: Diameter: 3 mm Liver: No focal lesion identified. Within normal limits in parenchymal echogenicity. IVC: No abnormality visualized. Pancreas: Visualized portion unremarkable. Spleen: Size and appearance within normal limits. Right Kidney: Length: 10.9 cm. Echogenicity within normal limits. No mass or hydronephrosis visualized. Left Kidney: Length: 10.7 cm. Echogenicity within normal limits. No mass or hydronephrosis visualized. Abdominal aorta: No aneurysm visualized. Other  findings: None. IMPRESSION: Normal abdomen ultrasound. Electronically Signed   By: Bary RichardStan  Maynard M.D.   On: 06/01/2015 20:02   I have personally reviewed and evaluated these images and lab results as part of my medical decision-making.   EKG Interpretation None      MDM   Final diagnoses:  Epigastric pain   Patient with epigastric abdominal pain for 3 days, vomiting 1 today. Patient does not appear to be in any distress, normal vital signs, abdomen is soft, tender epigastric area and right upper quadrant. Will check labs and urinalysis. GI cocktail and Zofran ordered  Patient continues to have pain. Will try Pepcid.  Patient feels better after Pepcid. Pending ultrasound.  Delayed ultrasound, called 3 over the last several hours, apparently ultrasound is back up. They're aware that she is waiting on it and will come get her soon as they can.   Patient's ultrasound is normal. Abdomen is soft. Cannot think she needs any further imaging. Her symptoms are most likely due to gastritis. I have instructed her to stop taking ibuprofen and naproxen, I have told her to stop smoking, no alcohol or spicy foods. Follow-up with primary care doctor. Will start on  Carafate and Pepcid.  Filed Vitals:   06/01/15 1317 06/01/15 2046  BP: 119/76 122/68  Pulse: 92 93  Temp: 98 F (36.7 C)   Resp: 18 16   I personally performed the services described in this documentation, which was scribed in my presence. The recorded information has been reviewed and is accurate.    Jaynie Crumble, PA-C 06/01/15 2054  Tilden Fossa, MD 06/02/15 [phone]

## 2015-06-01 NOTE — ED Notes (Signed)
Pt c/o epigastric pain onset x 3 days with x 1 vomiting episode today, denies diarrhea, denies injury to the area, denies CP & SOB, A&O x4

## 2015-08-19 ENCOUNTER — Emergency Department (HOSPITAL_COMMUNITY): Payer: BLUE CROSS/BLUE SHIELD

## 2015-08-19 ENCOUNTER — Encounter (HOSPITAL_COMMUNITY): Payer: Self-pay | Admitting: Emergency Medicine

## 2015-08-19 ENCOUNTER — Emergency Department (HOSPITAL_COMMUNITY)
Admission: EM | Admit: 2015-08-19 | Discharge: 2015-08-20 | Disposition: A | Discharge status: Home / Self Care | Payer: BLUE CROSS/BLUE SHIELD | Attending: Emergency Medicine | Admitting: Emergency Medicine

## 2015-08-19 DIAGNOSIS — Z791 Long term (current) use of non-steroidal anti-inflammatories (NSAID): Secondary | ICD-10-CM | POA: Insufficient documentation

## 2015-08-19 DIAGNOSIS — R0602 Shortness of breath: Secondary | ICD-10-CM | POA: Insufficient documentation

## 2015-08-19 DIAGNOSIS — R0789 Other chest pain: Secondary | ICD-10-CM | POA: Insufficient documentation

## 2015-08-19 DIAGNOSIS — M7989 Other specified soft tissue disorders: Secondary | ICD-10-CM | POA: Diagnosis not present

## 2015-08-19 DIAGNOSIS — F17200 Nicotine dependence, unspecified, uncomplicated: Secondary | ICD-10-CM | POA: Insufficient documentation

## 2015-08-19 LAB — BASIC METABOLIC PANEL
Anion gap: 6 (ref 5–15)
BUN: 9 mg/dL (ref 6–20)
CHLORIDE: 110 mmol/L (ref 101–111)
CO2: 22 mmol/L (ref 22–32)
CREATININE: 0.76 mg/dL (ref 0.44–1.00)
Calcium: 8.9 mg/dL (ref 8.9–10.3)
GFR calc Af Amer: >60 mL/min (ref >60)
GFR calc non Af Amer: >60 mL/min (ref >60)
GLUCOSE: 93 mg/dL (ref 65–99)
POTASSIUM: 3.6 mmol/L (ref 3.5–5.1)
Sodium: 138 mmol/L (ref 135–145)

## 2015-08-19 LAB — CBC
HEMATOCRIT: 41.1 % (ref 36.0–46.0)
Hemoglobin: 14 g/dL (ref 12.0–15.0)
MCH: 28.9 pg (ref 26.0–34.0)
MCHC: 34.1 g/dL (ref 30.0–36.0)
MCV: 84.9 fL (ref 78.0–100.0)
PLATELETS: 373 10*3/uL (ref 150–400)
RBC: 4.84 MIL/uL (ref 3.87–5.11)
RDW: 12.3 % (ref 11.5–15.5)
WBC: 11.1 10*3/uL — ABNORMAL HIGH (ref 4.0–10.5)

## 2015-08-19 LAB — I-STAT TROPONIN, ED: Troponin i, poc: 0 ng/mL (ref 0.00–0.08)

## 2015-08-19 LAB — I-STAT BETA HCG BLOOD, ED (MC, WL, AP ONLY): I-stat hCG, quantitative: <5 m[IU]/mL (ref <5)

## 2015-08-19 LAB — BRAIN NATRIURETIC PEPTIDE: B NATRIURETIC PEPTIDE 5: 10.3 pg/mL (ref 0.0–100.0)

## 2015-08-19 LAB — D-DIMER, QUANTITATIVE: D-Dimer, Quant: <0.27 ug/mL-FEU (ref 0.00–0.50)

## 2015-08-19 NOTE — ED Triage Notes (Signed)
Patient presents for SOB, chest heaviness and bilateral ankle swelling starting this morning. Denies N/V, cough, fever. A&O x4.

## 2015-08-20 MED ORDER — ALBUTEROL SULFATE HFA 108 (90 BASE) MCG/ACT IN AERS
2.0000 | INHALATION_SPRAY | Freq: Once | RESPIRATORY_TRACT | Status: AC
  Administered 2015-08-20: 2 via RESPIRATORY_TRACT
  Filled 2015-08-20: qty 6.7

## 2015-08-20 NOTE — Discharge Instructions (Signed)
Stop smoking. Use inhaler 2 puffs every 4 hrs as needed. Follow up with family doctor. Your blood work and chest xray are normal today.

## 2015-08-20 NOTE — ED Provider Notes (Signed)
[facility]-EMERGENCY DEPT Provider Note   CSN: [id number] Arrival date & time: 08/19/15  1928     History   Chief Complaint Chief Complaint  Patient presents with  . Shortness of Breath    HPI Anna Decker is a 25 y.o. female.  HPI Anna Decker is a 25 y.o. female with no major medical problems, presents to emergency department complaining of shortness of breath. Patient states shortness of breath started this morning, when she woke up. He reports chest tightness, but denies any chest pain. States "it feels heavy." She states she has had some swelling to bilateral feet, denies any pain to feet. Denies any recent travel or surgeries. She is on Mirena IUD. She denies any cough. She is a smoker. She has no history of similar symptoms in the past. Has not tried any treatment prior to coming in. She feels like nothing is making her symptoms better or worse. System persisted through the days that she decided to come and be evaluated.  History reviewed. No pertinent past medical history.  There are no active problems to display for this patient.   Past Surgical History:  Procedure Laterality Date  . TONSILLECTOMY      OB History    No data available       Home Medications    Prior to Admission medications   Medication Sig Start Date End Date Taking? Authorizing Provider  acetaminophen (TYLENOL) 500 MG tablet Take 1,000 mg by mouth every 6 (six) hours as needed for mild pain.   Yes Historical Provider, MD  azithromycin (ZITHROMAX) 250 MG tablet Take 1 tablet (250 mg total) by mouth daily. Take first 2 tablets together, then 1 every day until finished. Patient not taking: Reported on 08/19/2015 04/07/15   Barbaraann BarthelJames O Breen, MD  doxycycline (VIBRAMYCIN) 100 MG capsule Take 1 capsule (100 mg total) by mouth 2 (two) times daily. Patient not taking: Reported on 08/19/2015 12/21/11   Fayrene HelperBowie Tran, PA-C  famotidine (PEPCID) 20 MG tablet Take 1 tablet (20 mg total) by mouth 2 (two) times  daily. Patient not taking: Reported on 08/19/2015 06/01/15   Jeffrie Stander, PA-C  fluticasone (FLONASE) 50 MCG/ACT nasal spray Place 2 sprays into both nostrils daily. Patient not taking: Reported on 08/19/2015 04/07/15   Barbaraann BarthelJames O Breen, MD  guaiFENesin (MUCINEX) 600 MG 12 hr tablet Take 2 tablets (1,200 mg total) by mouth 2 (two) times daily. Patient not taking: Reported on 08/19/2015 04/07/15   Barbaraann BarthelJames O Breen, MD  ipratropium (ATROVENT) 0.06 % nasal spray Place 2 sprays into both nostrils 4 (four) times daily. Patient not taking: Reported on 08/19/2015 01/20/15   Charm RingsErin J Honig, MD  sucralfate (CARAFATE) 1 g tablet Take 1 tablet (1 g total) by mouth 4 (four) times daily -  with meals and at bedtime. Patient not taking: Reported on 08/19/2015 06/01/15   Darien Mignogna, PA-C  sulfamethoxazole-trimethoprim (SEPTRA DS) 800-160 MG per tablet Take 1 tablet by mouth every 12 (twelve) hours. Patient not taking: Reported on 08/19/2015 01/03/12   Marlon Peliffany Greene, PA-C  traMADol (ULTRAM) 50 MG tablet Take 1 tablet (50 mg total) by mouth every 6 (six) hours as needed for pain. Patient not taking: Reported on 08/19/2015 01/03/12   Marlon Peliffany Greene, PA-C    Family History No family history on file.  Social History Social History  Substance Use Topics  . Smoking status: Current Every Day Smoker    Packs/day: 0.50  . Smokeless tobacco: Never Used  . Alcohol  use No     Allergies   Latex   Review of Systems Review of Systems  Constitutional: Negative for chills and fever.  Respiratory: Positive for chest tightness and shortness of breath. Negative for cough.   Cardiovascular: Positive for leg swelling. Negative for chest pain and palpitations.  Gastrointestinal: Negative for abdominal pain, diarrhea, nausea and vomiting.  Genitourinary: Negative for dysuria, flank pain and pelvic pain.  Musculoskeletal: Negative for arthralgias, myalgias, neck pain and neck stiffness.  Skin: Negative for rash.    Neurological: Negative for dizziness, weakness and headaches.  All other systems reviewed and are negative.    Physical Exam Updated Vital Signs BP 117/63   Pulse 83   Temp 97.8 F (36.6 C) (Oral)   Resp 21   SpO2 98%   Physical Exam  Constitutional: She appears well-developed and well-nourished. No distress.  HENT:  Head: Normocephalic.  Eyes: Conjunctivae are normal.  Neck: Neck supple.  Cardiovascular: Normal rate, regular rhythm and normal heart sounds.   Pulmonary/Chest: Effort normal and breath sounds normal. No respiratory distress. She has no wheezes. She has no rales. She exhibits no tenderness.  Abdominal: Soft. Bowel sounds are normal. She exhibits no distension. There is no tenderness. There is no rebound.  Musculoskeletal:  Mild, trace nonpitting edema in bilateral feet. Normal calves with no swelling or tenderness to palpation. Negative Homans sign bilaterally  Neurological: She is alert.  Skin: Skin is warm and dry.  Psychiatric: She has a normal mood and affect. Her behavior is normal.  Nursing note and vitals reviewed.    ED Treatments / Results  Labs (all labs ordered are listed, but only abnormal results are displayed) Labs Reviewed  CBC - Abnormal; Notable for the following:       Result Value   WBC 11.1 (*)    All other components within normal limits  BASIC METABOLIC PANEL  D-DIMER, QUANTITATIVE (NOT AT [facility])  BRAIN NATRIURETIC PEPTIDE  I-STAT TROPOININ, ED  I-STAT BETA HCG BLOOD, ED ([facility], [facility], [facility] ONLY)    EKG  EKG Interpretation None       Radiology Dg Chest 2 View  Result Date: 08/19/2015 CLINICAL DATA:  Shortness of breath and chest heaviness. EXAM: CHEST  2 VIEW COMPARISON:  07/28/2008 FINDINGS: The heart size and mediastinal contours are within normal limits. Both lungs are clear. The visualized skeletal structures are unremarkable. IMPRESSION: No active cardiopulmonary disease. Electronically Signed   By: Richarda OverlieAdam  Henn M.D.   On:  08/19/2015 21:15    Procedures Procedures (including critical care time)  Medications Ordered in ED Medications  albuterol (PROVENTIL HFA;VENTOLIN HFA) 108 (90 Base) MCG/ACT inhaler 2 puff (2 puffs Inhalation Given 08/20/15 0010)     Initial Impression / Assessment and Plan / ED Course  I have reviewed the triage vital signs and the nursing notes.  Pertinent labs & imaging results that were available during my care of the patient were reviewed by me and considered in my medical decision making (see chart for details).  Clinical Course  Patient in emergency department with shortness of breath and chest heaviness that started this morning. Mild swelling to bilateral feet for the last several days. She does report recent weight gain because she states she has been eating poorly. She states she gained almost 30 pounds in the last 2 months. She appears to be in no acute distress. Vital signs all within normal. Labs already ordered at triage. Negative d-dimer, normal BNP, normal electrolytes and blood counts.  Normal troponin EKG. No findings to explain patient's shortness of breath. Given albuterol inhaler to see that would help, patient states it helped some. We'll discharge her home with an inhaler, follow with primary care doctor if symptoms persist.   Vitals:   08/19/15 1938 08/19/15 2241 08/19/15 2330  BP: 137/87 109/77 117/63  Pulse: 102 78 83  Resp: 20 12 21   Temp: 97.8 F (36.6 C)    TempSrc: Oral    SpO2: 100% 100% 98%    Final Clinical Impressions(s) / ED Diagnoses   Final diagnoses:  Shortness of breath    New Prescriptions Discharge Medication List as of 08/20/2015 12:13 AM       Jaynie Crumble, PA-C 08/20/15 0136    Bethann Berkshire, MD 08/20/15 1520

## 2015-12-21 ENCOUNTER — Inpatient Hospital Stay (HOSPITAL_COMMUNITY)
Admission: AD | Admit: 2015-12-21 | Discharge: 2015-12-22 | Disposition: A | Discharge status: Home / Self Care | Payer: Medicaid Other | Source: Ambulatory Visit | Attending: Obstetrics and Gynecology | Admitting: Obstetrics and Gynecology

## 2015-12-21 DIAGNOSIS — O468X1 Other antepartum hemorrhage, first trimester: Secondary | ICD-10-CM | POA: Diagnosis not present

## 2015-12-21 DIAGNOSIS — O418X10 Other specified disorders of amniotic fluid and membranes, first trimester, not applicable or unspecified: Secondary | ICD-10-CM | POA: Diagnosis not present

## 2015-12-21 DIAGNOSIS — O208 Other hemorrhage in early pregnancy: Secondary | ICD-10-CM | POA: Diagnosis not present

## 2015-12-21 DIAGNOSIS — F17210 Nicotine dependence, cigarettes, uncomplicated: Secondary | ICD-10-CM | POA: Diagnosis not present

## 2015-12-21 DIAGNOSIS — O209 Hemorrhage in early pregnancy, unspecified: Secondary | ICD-10-CM | POA: Diagnosis present

## 2015-12-21 DIAGNOSIS — O99331 Smoking (tobacco) complicating pregnancy, first trimester: Secondary | ICD-10-CM | POA: Insufficient documentation

## 2015-12-21 DIAGNOSIS — Z3A09 9 weeks gestation of pregnancy: Secondary | ICD-10-CM | POA: Insufficient documentation

## 2015-12-21 HISTORY — DX: Unspecified asthma, uncomplicated: J45.909

## 2015-12-22 ENCOUNTER — Encounter (HOSPITAL_COMMUNITY): Payer: Self-pay | Admitting: *Deleted

## 2015-12-22 ENCOUNTER — Inpatient Hospital Stay (HOSPITAL_COMMUNITY): Payer: Medicaid Other

## 2015-12-22 DIAGNOSIS — O418X10 Other specified disorders of amniotic fluid and membranes, first trimester, not applicable or unspecified: Secondary | ICD-10-CM

## 2015-12-22 DIAGNOSIS — O468X1 Other antepartum hemorrhage, first trimester: Secondary | ICD-10-CM | POA: Diagnosis not present

## 2015-12-22 LAB — URINALYSIS, ROUTINE W REFLEX MICROSCOPIC
BILIRUBIN URINE: NEGATIVE
GLUCOSE, UA: NEGATIVE mg/dL
Ketones, ur: NEGATIVE mg/dL
Leukocytes, UA: NEGATIVE
NITRITE: NEGATIVE
PH: 7 (ref 5.0–8.0)
Protein, ur: NEGATIVE mg/dL
SPECIFIC GRAVITY, URINE: 1.001 — AB (ref 1.005–1.030)
Squamous Epithelial / LPF: NONE SEEN

## 2015-12-22 LAB — CBC WITH DIFFERENTIAL/PLATELET
BASOS PCT: 0 %
Basophils Absolute: 0 10*3/uL (ref 0.0–0.1)
Eosinophils Absolute: 0.1 10*3/uL (ref 0.0–0.7)
Eosinophils Relative: 1 %
HEMATOCRIT: 33 % — AB (ref 36.0–46.0)
HEMOGLOBIN: 11.8 g/dL — AB (ref 12.0–15.0)
LYMPHS ABS: 3.2 10*3/uL (ref 0.7–4.0)
LYMPHS PCT: 26 %
MCH: 28.9 pg (ref 26.0–34.0)
MCHC: 35.8 g/dL (ref 30.0–36.0)
MCV: 80.9 fL (ref 78.0–100.0)
MONO ABS: 0.6 10*3/uL (ref 0.1–1.0)
MONOS PCT: 5 %
NEUTROS ABS: 8.3 10*3/uL — AB (ref 1.7–7.7)
NEUTROS PCT: 68 %
Platelets: 314 10*3/uL (ref 150–400)
RBC: 4.08 MIL/uL (ref 3.87–5.11)
RDW: 12.7 % (ref 11.5–15.5)
WBC: 12.2 10*3/uL — ABNORMAL HIGH (ref 4.0–10.5)

## 2015-12-22 LAB — ABO/RH: ABO/RH(D): O POS

## 2015-12-22 LAB — POCT PREGNANCY, URINE: Preg Test, Ur: POSITIVE — AB

## 2015-12-22 LAB — HCG, QUANTITATIVE, PREGNANCY: HCG, BETA CHAIN, QUANT, S: 82151 m[IU]/mL — AB (ref <5)

## 2015-12-22 NOTE — Discharge Instructions (Signed)
Subchorionic Hematoma °A subchorionic hematoma is a gathering of blood between the outer wall of the placenta and the inner wall of the womb (uterus). The placenta is the organ that connects the fetus to the wall of the uterus. The placenta performs the feeding, breathing (oxygen to the fetus), and waste removal (excretory work) of the fetus.  °Subchorionic hematoma is the most common abnormality found on a result from ultrasonography done during the first trimester or early second trimester of pregnancy. If there has been little or no vaginal bleeding, early small hematomas usually shrink on their own and do not affect your baby or pregnancy. The blood is gradually absorbed over 1-2 weeks. When bleeding starts later in pregnancy or the hematoma is larger or occurs in an older pregnant woman, the outcome may not be as good. Larger hematomas may get bigger, which increases the chances for miscarriage. Subchorionic hematoma also increases the risk of premature detachment of the placenta from the uterus, preterm (premature) labor, and stillbirth. °HOME CARE INSTRUCTIONS °· Stay on bed rest if your health care provider recommends this. Although bed rest will not prevent more bleeding or prevent a miscarriage, your health care provider may recommend bed rest until you are advised otherwise. °· Avoid heavy lifting (more than 10 lb [4.5 kg]), exercise, sexual intercourse, or douching as directed by your health care provider. °· Keep track of the number of pads you use each day and how soaked (saturated) they are. Write down this information. °· Do not use tampons. °· Keep all follow-up appointments as directed by your health care provider. Your health care provider may ask you to have follow-up blood tests or ultrasound tests or both. °SEEK IMMEDIATE MEDICAL CARE IF: °· You have severe cramps in your stomach, back, abdomen, or pelvis. °· You have a fever. °· You pass large clots or tissue. Save any tissue for your health  care provider to look at. °· Your bleeding increases or you become lightheaded, feel weak, or have fainting episodes. °This information is not intended to replace advice given to you by your health care provider. Make sure you discuss any questions you have with your health care provider. °Document Released: 04/05/2006 Document Revised: 01/09/2014 Document Reviewed: 07/18/2012 °Elsevier Interactive Patient Education © 2017 [employer] ° °

## 2015-12-22 NOTE — MAU Note (Signed)
Pt reports bleeding after intercourse tonight. Some lower abd cramping.

## 2015-12-22 NOTE — MAU Provider Note (Signed)
History     CSN: [id number]  Arrival date and time: 12/21/15 2350   First Provider Initiated Contact with Patient 12/22/15 0045      No chief complaint on file.  HPI Anna Decker is a 25 y.o. G2P1001 at 4739w3d who presents to MAU today with complaint of vaginal bleeding. The patient [facility]. She had an US yesterday and was told everything was fine. She states a few episodes of light bleeding noted earlier in the pregnancy. She had intercourse tonight and started bleeding very heavily afterwards. She states mild abdominal cramping. She denies fever.    OB History    Gravida Para Term Preterm AB Living   2 1 1     1    SAB TAB Ectopic Multiple Live Births           1      Past Medical History:  Diagnosis Date  . Asthma     Past Surgical History:  Procedure Laterality Date  . TONSILLECTOMY      No family history on file.  Social History  Substance Use Topics  . Smoking status: Current Every Day Smoker    Packs/day: 0.50  . Smokeless tobacco: Never Used  . Alcohol use No    Allergies:  Allergies  Allergen Reactions  . Latex Rash    Prescriptions Prior to Admission  Medication Sig Dispense Refill Last Dose  . acetaminophen (TYLENOL) 500 MG tablet Take 1,000 mg by mouth every 6 (six) hours as needed for mild pain.   08/18/2015 at Unknown time  . azithromycin (ZITHROMAX) 250 MG tablet Take 1 tablet (250 mg total) by mouth daily. Take first 2 tablets together, then 1 every day until finished. (Patient not taking: Reported on 08/19/2015) 6 tablet 0 Not Taking at Unknown time  . doxycycline (VIBRAMYCIN) 100 MG capsule Take 1 capsule (100 mg total) by mouth 2 (two) times daily. (Patient not taking: Reported on 08/19/2015) 28 capsule 0 Not Taking at Unknown time  . famotidine (PEPCID) 20 MG tablet Take 1 tablet (20 mg total) by mouth 2 (two) times daily. (Patient not taking: Reported on 08/19/2015) 30 tablet 0 Not Taking at Unknown time   . fluticasone (FLONASE) 50 MCG/ACT nasal spray Place 2 sprays into both nostrils daily. (Patient not taking: Reported on 08/19/2015) 16 g 0 Not Taking at Unknown time  . guaiFENesin (MUCINEX) 600 MG 12 hr tablet Take 2 tablets (1,200 mg total) by mouth 2 (two) times daily. (Patient not taking: Reported on 08/19/2015) 20 tablet 0 Not Taking at Unknown time  . ipratropium (ATROVENT) 0.06 % nasal spray Place 2 sprays into both nostrils 4 (four) times daily. (Patient not taking: Reported on 08/19/2015) 15 mL 0 Not Taking at Unknown time  . sucralfate (CARAFATE) 1 g tablet Take 1 tablet (1 g total) by mouth 4 (four) times daily -  with meals and at bedtime. (Patient not taking: Reported on 08/19/2015) 20 tablet 0 Not Taking at Unknown time  . sulfamethoxazole-trimethoprim (SEPTRA DS) 800-160 MG per tablet Take 1 tablet by mouth every 12 (twelve) hours. (Patient not taking: Reported on 08/19/2015) 10 tablet 0 Not Taking at Unknown time  . traMADol (ULTRAM) 50 MG tablet Take 1 tablet (50 mg total) by mouth every 6 (six) hours as needed for pain. (Patient not taking: Reported on 08/19/2015) 15 tablet 0 Not Taking at Unknown time    Review of Systems  Constitutional: Negative for fever and  malaise/fatigue.  Gastrointestinal: Positive for abdominal pain. Negative for constipation, diarrhea, nausea and vomiting.  Genitourinary:       + vaginal bleeding   Physical Exam   Blood pressure 117/58, pulse 96, temperature 97.5 F (36.4 C), temperature source Oral, resp. rate 18, height 5\' 2"  (1.575 m), weight 166 lb 6 oz (75.5 kg), last menstrual period 10/17/2015, SpO2 100 %.  Physical Exam  Nursing note and vitals reviewed. Constitutional: She is oriented to person, place, and time. She appears well-developed and well-nourished. No distress.  HENT:  Head: Normocephalic and atraumatic.  Cardiovascular: Normal rate.   Respiratory: Effort normal.  GI: Soft. She exhibits no distension and no mass. There is no  tenderness. There is no rebound and no guarding.  Genitourinary: There is bleeding (moderate bleeding with large clot) in the vagina. No vaginal discharge found.  Genitourinary Comments: Cervix is visually closed  Neurological: She is alert and oriented to person, place, and time.  Skin: Skin is warm and dry. No erythema.  Psychiatric: She has a normal mood and affect.    Results for orders placed or performed during the hospital encounter of 12/21/15 (from the past 24 hour(s))  Urinalysis, Routine w reflex microscopic     Status: Abnormal   Collection Time: 12/21/15 11:54 PM  Result Value Ref Range   Color, Urine STRAW (A) YELLOW   APPearance CLEAR CLEAR   Specific Gravity, Urine 1.001 (L) 1.005 - 1.030   pH 7.0 5.0 - 8.0   Glucose, UA NEGATIVE NEGATIVE mg/dL   Hgb urine dipstick LARGE (A) NEGATIVE   Bilirubin Urine NEGATIVE NEGATIVE   Ketones, ur NEGATIVE NEGATIVE mg/dL   Protein, ur NEGATIVE NEGATIVE mg/dL   Nitrite NEGATIVE NEGATIVE   Leukocytes, UA NEGATIVE NEGATIVE   RBC / HPF 0-5 0 - 5 RBC/hpf   WBC, UA 0-5 0 - 5 WBC/hpf   Bacteria, UA RARE (A) NONE SEEN   Squamous Epithelial / LPF NONE SEEN NONE SEEN  Pregnancy, urine POC     Status: Abnormal   Collection Time: 12/22/15 12:11 AM  Result Value Ref Range   Preg Test, Ur POSITIVE (A) NEGATIVE  CBC with Differential/Platelet     Status: Abnormal   Collection Time: 12/22/15  1:13 AM  Result Value Ref Range   WBC 12.2 (H) 4.0 - 10.5 K/uL   RBC 4.08 3.87 - 5.11 MIL/uL   Hemoglobin 11.8 (L) 12.0 - 15.0 g/dL   HCT 95.233.0 (L) 84.136.0 - 32.446.0 %   MCV 80.9 78.0 - 100.0 fL   MCH 28.9 26.0 - 34.0 pg   MCHC 35.8 30.0 - 36.0 g/dL   RDW 40.112.7 02.711.5 - 25.315.5 %   Platelets 314 150 - 400 K/uL   Neutrophils Relative % 68 %   Neutro Abs 8.3 (H) 1.7 - 7.7 K/uL   Lymphocytes Relative 26 %   Lymphs Abs 3.2 0.7 - 4.0 K/uL   Monocytes Relative 5 %   Monocytes Absolute 0.6 0.1 - 1.0 K/uL   Eosinophils Relative 1 %   Eosinophils Absolute 0.1 0.0 -  0.7 K/uL   Basophils Relative 0 %   Basophils Absolute 0.0 0.0 - 0.1 K/uL  ABO/Rh     Status: None (Preliminary result)   Collection Time: 12/22/15  1:13 AM  Result Value Ref Range   ABO/RH(D) O POS    [place] Ob Comp Less 14 Wks  Result Date: 12/22/2015 CLINICAL DATA:  Vaginal bleeding. EXAM: OBSTETRIC <14 WK US AND TRANSVAGINAL OB [place]  TECHNIQUE: Both transabdominal and transvaginal ultrasound examinations were performed for complete evaluation of the gestation as well as the maternal uterus, adnexal regions, and pelvic cul-de-sac. Transvaginal technique was performed to assess early pregnancy. COMPARISON:  None. FINDINGS: Intrauterine gestational sac: Single Yolk sac:  Visible Embryo:  Visible Cardiac Activity: Visible Heart Rate: 154  bpm MSD:   mm    w     d CRL:  12  mm   7 w   3 d                  [place] EDC: 08/06/2016 Subchorionic hemorrhage:  Moderate subchorionic hemorrhage. Maternal uterus/adnexae: Simple 5 cm right ovarian cyst. Normal left ovary. No abnormal pelvic fluid collections. IMPRESSION: 1. Single living intrauterine gestation measuring 7 weeks 3 days by crown-rump length. 2. Moderate subchorionic hemorrhage. 3. 5 cm simple right ovarian cyst. Electronically Signed   By: Ellery Plunk M.D.   On: 12/22/2015 02:02   US Ob Transvaginal  Result Date: 12/22/2015 CLINICAL DATA:  Vaginal bleeding. EXAM: OBSTETRIC <14 WK [place] AND TRANSVAGINAL OB US TECHNIQUE: Both transabdominal and transvaginal ultrasound examinations were performed for complete evaluation of the gestation as well as the maternal uterus, adnexal regions, and pelvic cul-de-sac. Transvaginal technique was performed to assess early pregnancy. COMPARISON:  None. FINDINGS: Intrauterine gestational sac: Single Yolk sac:  Visible Embryo:  Visible Cardiac Activity: Visible Heart Rate: 154  bpm MSD:   mm    w     d CRL:  12  mm   7 w   3 d                  [place] EDC: 08/06/2016 Subchorionic hemorrhage:  Moderate subchorionic hemorrhage.  Maternal uterus/adnexae: Simple 5 cm right ovarian cyst. Normal left ovary. No abnormal pelvic fluid collections. IMPRESSION: 1. Single living intrauterine gestation measuring 7 weeks 3 days by crown-rump length. 2. Moderate subchorionic hemorrhage. 3. 5 cm simple right ovarian cyst. Electronically Signed   By: Ellery Plunk M.D.   On: 12/22/2015 02:02    MAU Course  Procedures None  MDM +UPT UA, wet prep, GC/chlamydia, CBC, ABO/Rh, quant hCG, HIV, RPR and [place] today to rule out ectopic pregnancy  Assessment and Plan  A: SIUP at [redacted]w[redacted]d Moderate subchorionic hemorrhage Vaginal bleeding in pregnancy, first trimester  P: Discharge home Bleeding precautions and pelvic rest discussed Patient advised to follow-up with Dr. Shawnie Pons as scheduled for routine prenatal care or sooner PRN Patient may return to MAU as needed or if her condition were to change or worsen   Marny Lowenstein, PA-C  12/22/2015, 2:19 AM

## 2015-12-31 HISTORY — PX: DILATION AND CURETTAGE OF UTERUS: SHX78

## 2016-04-17 ENCOUNTER — Encounter (HOSPITAL_COMMUNITY): Payer: Self-pay

## 2016-04-17 ENCOUNTER — Emergency Department (HOSPITAL_COMMUNITY)
Admission: EM | Admit: 2016-04-17 | Discharge: 2016-04-17 | Disposition: A | Discharge status: Home / Self Care | Payer: Medicaid Other | Attending: Emergency Medicine | Admitting: Emergency Medicine

## 2016-04-17 DIAGNOSIS — Z5321 Procedure and treatment not carried out due to patient leaving prior to being seen by health care provider: Secondary | ICD-10-CM | POA: Diagnosis not present

## 2016-04-17 DIAGNOSIS — T7840XA Allergy, unspecified, initial encounter: Secondary | ICD-10-CM | POA: Diagnosis not present

## 2016-04-17 NOTE — ED Triage Notes (Signed)
Pt states this morning she began itching all over her body. No rash or hives noted. She reports feeling as though she cant swallow. Pt controlling secretions. Lung sounds clear to auscultation, airway intact. Pt denies any new soaps or detergents.

## 2016-04-17 NOTE — ED Notes (Signed)
Pt reported she was going to go outside to find her boyfriend, patient has not returned.

## 2016-04-17 NOTE — ED Notes (Signed)
Unable to locate pt x1

## 2016-04-17 NOTE — ED Notes (Addendum)
Pt up to nurses station requesting to leave and go to [facility], patient encouraged to stay and see a physician. Pt agrees to sit "and wait a little longer." Reports she has to leave due to sons schedule if it will be a long time.

## 2016-05-11 ENCOUNTER — Inpatient Hospital Stay (HOSPITAL_COMMUNITY)
Admission: AD | Admit: 2016-05-11 | Discharge: 2016-05-11 | Discharge status: Against Medical Advice | Payer: Medicaid Other | Source: Ambulatory Visit | Attending: Obstetrics and Gynecology | Admitting: Obstetrics and Gynecology

## 2016-05-11 NOTE — MAU Note (Signed)
Not in Lobby x1 

## 2016-05-11 NOTE — MAU Note (Signed)
Not in lobby x2.

## 2016-05-11 NOTE — MAU Note (Signed)
Not in lobby x 3 assume pt eloped without being seen.

## 2016-05-17 ENCOUNTER — Encounter (HOSPITAL_COMMUNITY): Payer: Self-pay | Admitting: *Deleted

## 2016-05-17 ENCOUNTER — Ambulatory Visit (HOSPITAL_COMMUNITY)
Admission: EM | Admit: 2016-05-17 | Discharge: 2016-05-17 | Disposition: A | Discharge status: Home / Self Care | Payer: Medicaid Other | Source: Home / Self Care

## 2016-05-17 ENCOUNTER — Emergency Department (HOSPITAL_COMMUNITY)
Admission: EM | Admit: 2016-05-17 | Discharge: 2016-05-17 | Disposition: A | Discharge status: Home / Self Care | Payer: Medicaid Other | Attending: Emergency Medicine | Admitting: Emergency Medicine

## 2016-05-17 ENCOUNTER — Emergency Department (HOSPITAL_COMMUNITY): Payer: Medicaid Other

## 2016-05-17 ENCOUNTER — Ambulatory Visit (HOSPITAL_COMMUNITY): Payer: Medicaid Other

## 2016-05-17 DIAGNOSIS — Z79899 Other long term (current) drug therapy: Secondary | ICD-10-CM | POA: Diagnosis not present

## 2016-05-17 DIAGNOSIS — R52 Pain, unspecified: Secondary | ICD-10-CM | POA: Insufficient documentation

## 2016-05-17 DIAGNOSIS — Z91040 Latex allergy status: Secondary | ICD-10-CM | POA: Diagnosis not present

## 2016-05-17 DIAGNOSIS — B9689 Other specified bacterial agents as the cause of diseases classified elsewhere: Secondary | ICD-10-CM | POA: Diagnosis not present

## 2016-05-17 DIAGNOSIS — N760 Acute vaginitis: Secondary | ICD-10-CM | POA: Insufficient documentation

## 2016-05-17 DIAGNOSIS — R1031 Right lower quadrant pain: Secondary | ICD-10-CM

## 2016-05-17 DIAGNOSIS — F17200 Nicotine dependence, unspecified, uncomplicated: Secondary | ICD-10-CM | POA: Diagnosis not present

## 2016-05-17 DIAGNOSIS — J45909 Unspecified asthma, uncomplicated: Secondary | ICD-10-CM | POA: Diagnosis not present

## 2016-05-17 LAB — COMPREHENSIVE METABOLIC PANEL
ALK PHOS: 86 U/L (ref 38–126)
ALT: 15 U/L (ref 14–54)
ANION GAP: 8 (ref 5–15)
AST: 17 U/L (ref 15–41)
Albumin: 4.1 g/dL (ref 3.5–5.0)
BILIRUBIN TOTAL: 0.5 mg/dL (ref 0.3–1.2)
BUN: 5 mg/dL — ABNORMAL LOW (ref 6–20)
CALCIUM: 8.9 mg/dL (ref 8.9–10.3)
CO2: 22 mmol/L (ref 22–32)
Chloride: 109 mmol/L (ref 101–111)
Creatinine, Ser: 0.54 mg/dL (ref 0.44–1.00)
GLUCOSE: 104 mg/dL — AB (ref 65–99)
POTASSIUM: 3.9 mmol/L (ref 3.5–5.1)
Sodium: 139 mmol/L (ref 135–145)
TOTAL PROTEIN: 7 g/dL (ref 6.5–8.1)

## 2016-05-17 LAB — URINALYSIS, ROUTINE W REFLEX MICROSCOPIC
BACTERIA UA: NONE SEEN
Bilirubin Urine: NEGATIVE
Glucose, UA: NEGATIVE mg/dL
Ketones, ur: NEGATIVE mg/dL
LEUKOCYTES UA: NEGATIVE
NITRITE: NEGATIVE
Protein, ur: NEGATIVE mg/dL
SPECIFIC GRAVITY, URINE: 1.009 (ref 1.005–1.030)
pH: 5 (ref 5.0–8.0)

## 2016-05-17 LAB — WET PREP, GENITAL
Trich, Wet Prep: NONE SEEN
Yeast Wet Prep HPF POC: NONE SEEN

## 2016-05-17 LAB — LIPASE, BLOOD: Lipase: 20 U/L (ref 11–51)

## 2016-05-17 LAB — CBC
HEMATOCRIT: 43.2 % (ref 36.0–46.0)
HEMOGLOBIN: 14.4 g/dL (ref 12.0–15.0)
MCH: 27.9 pg (ref 26.0–34.0)
MCHC: 33.3 g/dL (ref 30.0–36.0)
MCV: 83.7 fL (ref 78.0–100.0)
Platelets: 341 10*3/uL (ref 150–400)
RBC: 5.16 MIL/uL — ABNORMAL HIGH (ref 3.87–5.11)
RDW: 12.5 % (ref 11.5–15.5)
WBC: 9.5 10*3/uL (ref 4.0–10.5)

## 2016-05-17 LAB — I-STAT BETA HCG BLOOD, ED (MC, WL, AP ONLY)

## 2016-05-17 MED ORDER — MORPHINE SULFATE (PF) 4 MG/ML IV SOLN
4.0000 mg | Freq: Once | INTRAVENOUS | Status: AC
  Administered 2016-05-17: 4 mg via INTRAVENOUS
  Filled 2016-05-17: qty 1

## 2016-05-17 MED ORDER — HYDROCODONE-ACETAMINOPHEN 5-325 MG PO TABS: 1.0000 | ORAL_TABLET | ORAL | 0 refills | Status: DC | PRN

## 2016-05-17 MED ORDER — IOPAMIDOL (ISOVUE-300) INJECTION 61%
INTRAVENOUS | Status: AC
  Administered 2016-05-17: 100 mL
  Filled 2016-05-17: qty 100

## 2016-05-17 MED ORDER — METRONIDAZOLE 500 MG PO TABS: 500.0000 mg | ORAL_TABLET | Freq: Two times a day (BID) | ORAL | 0 refills | Status: DC

## 2016-05-17 MED ORDER — SODIUM CHLORIDE 0.9 % IV BOLUS (SEPSIS)
1000.0000 mL | Freq: Once | INTRAVENOUS | Status: AC
  Administered 2016-05-17: 1000 mL via INTRAVENOUS

## 2016-05-17 MED ORDER — ONDANSETRON HCL 4 MG/2ML IJ SOLN
4.0000 mg | Freq: Once | INTRAMUSCULAR | Status: AC
  Administered 2016-05-17: 4 mg via INTRAVENOUS
  Filled 2016-05-17: qty 2

## 2016-05-17 MED ORDER — IBUPROFEN 600 MG PO TABS: 600.0000 mg | ORAL_TABLET | Freq: Four times a day (QID) | ORAL | 0 refills | Status: DC | PRN

## 2016-05-17 NOTE — ED Notes (Signed)
Declined W/C at D/C and was escorted to lobby by RN. 

## 2016-05-17 NOTE — ED Provider Notes (Signed)
[facility]-EMERGENCY DEPT Provider Note   CSN: [id number] Arrival date & time: 05/17/16  1143  By signing my name below, I, Anna Decker, attest that this documentation has been prepared under the direction and in the presence of Jacalyn Lefevre, MD. Electronically Signed: Marnette Burgess Decker, Scribe. 05/17/2016. 12:42 PM.  History   Chief Complaint Chief Complaint  Patient presents with  . Abdominal Pain   The history is provided by the patient and medical records. No language interpreter was used.    HPI Comments:  Anna Decker is a 26 y.o. female with a PMHx of Asthma presents to the Emergency Department complaining of gradually worsening, intermittent, 9/10 RLQ pain onset yesterday. Pt reports the pain arising yesterday and gradually worsening since that time radiating from her RLQ to generalized left side. The pain is intermittent in nature and 9/10 painful. When the pain leaves it feels as if she just performed "1000 pushups." Pt reports she had a positive home pregnancy test a week ago and been passing intermittent clots s/p the test. Her PCP worries if this pain could be related to a miscarriage. No h/o abdominal surgeries. Pt denies any other complaints at this time. LMP~ stated recent and normal.   Past Medical History:  Diagnosis Date  . Asthma     There are no active problems to display for this patient.   Past Surgical History:  Procedure Laterality Date  . TONSILLECTOMY      OB History    Gravida Para Term Preterm AB Living   2 1 1     1    SAB TAB Ectopic Multiple Live Births           1       Home Medications    Prior to Admission medications   Medication Sig Start Date End Date Taking? Authorizing Provider  acetaminophen (TYLENOL) 500 MG tablet Take 1,000 mg by mouth every 6 (six) hours as needed for mild pain.    [provider]  famotidine (PEPCID) 20 MG tablet Take 1 tablet (20 mg total) by mouth 2 (two) times daily. Patient not taking:  Reported on 08/19/2015 06/01/15   Jaynie Crumble, PA-C  HYDROcodone-acetaminophen (NORCO/VICODIN) 5-325 MG tablet Take 1 tablet by mouth every 4 (four) hours as needed. 05/17/16   Jacalyn Lefevre, MD  ibuprofen (ADVIL,MOTRIN) 600 MG tablet Take 1 tablet (600 mg total) by mouth every 6 (six) hours as needed. 05/17/16   Jacalyn Lefevre, MD  ipratropium (ATROVENT) 0.06 % nasal spray Place 2 sprays into both nostrils 4 (four) times daily. Patient not taking: Reported on 08/19/2015 01/20/15   Charm Rings, MD  metroNIDAZOLE (FLAGYL) 500 MG tablet Take 1 tablet (500 mg total) by mouth 2 (two) times daily. 05/17/16   Jacalyn Lefevre, MD    Family History History reviewed. No pertinent family history.  Social History Social History  Substance Use Topics  . Smoking status: Current Every Day Smoker    Packs/day: 0.50  . Smokeless tobacco: Never Used  . Alcohol use No   Allergies   Latex  Review of Systems Review of Systems All systems reviewed and are negative for acute change except as noted in the HPI.  Physical Exam Updated Vital Signs BP 133/84 (BP Location: Left Arm)   Pulse 98   Temp 99.1 F (37.3 C) (Oral)   Resp 18   LMP 05/10/2016   SpO2 100%   Physical Exam  Constitutional: She is oriented to person, place, and time.  She appears well-developed and well-nourished.  Congenital malformation of right arm.   HENT:  Head: Normocephalic.  Eyes: Conjunctivae are normal.  Cardiovascular: Normal rate.   Pulmonary/Chest: Effort normal.  Abdominal: She exhibits no distension. There is tenderness.  TTP RLQ  Genitourinary: Vagina normal. Right adnexum displays tenderness.  Musculoskeletal: Normal range of motion.  Neurological: She is alert and oriented to person, place, and time.  Skin: Skin is warm and dry.  Psychiatric: She has a normal mood and affect.  Nursing note and vitals reviewed.  CHAPERONE PRESENT FOR PHYSICAL EXAMINATION   ED Treatments / Results  DIAGNOSTIC  STUDIES:  Oxygen Saturation is 100% on RA, normal by my interpretation.    COORDINATION OF CARE:  12:39 PM Discussed treatment plan with pt at bedside including CT A/P, blood work, fluids, UA, [place], and wet prep and pt agreed to plan.  12:40 PM Pt was offered pain medication but declined at this time.  Labs (all labs ordered are listed, but only abnormal results are displayed) Labs Reviewed  WET PREP, GENITAL - Abnormal; Notable for the following:       Result Value   Clue Cells Wet Prep HPF POC PRESENT (*)    WBC, Wet Prep HPF POC MANY (*)    All other components within normal limits  COMPREHENSIVE METABOLIC PANEL - Abnormal; Notable for the following:    Glucose, Bld 104 (*)    BUN 5 (*)    All other components within normal limits  CBC - Abnormal; Notable for the following:    RBC 5.16 (*)    All other components within normal limits  URINALYSIS, ROUTINE W REFLEX MICROSCOPIC - Abnormal; Notable for the following:    Hgb urine dipstick MODERATE (*)    Squamous Epithelial / LPF 0-5 (*)    All other components within normal limits  LIPASE, BLOOD  I-STAT BETA HCG BLOOD, ED ([facility], [facility], [facility] ONLY)  GC/CHLAMYDIA PROBE AMP () NOT AT [facility]    EKG  EKG Interpretation None       Radiology Ct Abdomen Pelvis W Contrast  Result Date: 05/17/2016 CLINICAL DATA:  Periumbilical pain radiating into right lower quadrant. Nausea EXAM: CT ABDOMEN AND PELVIS WITH CONTRAST TECHNIQUE: Multidetector CT imaging of the abdomen and pelvis was performed using the standard protocol following bolus administration of intravenous contrast. CONTRAST:  ISOVUE-300 IOPAMIDOL (ISOVUE-300) INJECTION 61% COMPARISON:  December 21, 2011 FINDINGS: Lower chest: There is bibasilar atelectatic change. No frank edema or consolidation in the lung bases. Hepatobiliary: No focal liver lesions are apparent. Gallbladder wall does not appear appreciably thickened. However, a small focus of apparent pericholecystic  fluid noted. There is no biliary duct dilatation. Pancreas: No pancreatic mass or inflammatory focus. Spleen: No splenic lesions are evident. Adrenals/Urinary Tract: Adrenals appear normal bilaterally. Kidneys bilaterally show no evident mass or hydronephrosis on either side. There is an extrarenal pelvis on each side, an anatomic variant. There is no evident renal or ureteral calculus on either side. Urinary bladder is midline with wall thickness within normal limits. Stomach/Bowel: There is no appreciable bowel wall or mesenteric thickening. No bowel obstruction. No free air or portal venous air. Vascular/Lymphatic: There is no abdominal aortic aneurysm. No vascular lesions are evident. There is no appreciable adenopathy in the abdomen or pelvis. Reproductive: Uterus is anteverted. There is no evident pelvic mass. Other: Appendix appears normal. No abscess or ascites is apparent in the abdomen or pelvis. There is a minimal ventral hernia containing only  fat. Musculoskeletal: There are no blastic or lytic bone lesions. There is no intramuscular or abdominal wall lesion. IMPRESSION: There is a questionable small focus of pericholecystic fluid. Gallbladder does not appear thickened. This finding warrants correlation with ultrasound of the gallbladder to further evaluate. Appendix appears normal. No bowel wall thickening or bowel obstruction. No abscess. No renal or ureteral calculus.  No hydronephrosis. There is a minimal ventral hernia containing only fat. Electronically Signed   By: Bretta BangWilliam  Woodruff III M.D.   On: 05/17/2016 13:59   [place] Abdomen Limited Ruq  Result Date: 05/17/2016 CLINICAL DATA:  Right upper quadrant pain EXAM: US ABDOMEN LIMITED - RIGHT UPPER QUADRANT COMPARISON:  Abdominal CT from the same day FINDINGS: Gallbladder: No gallstones or wall thickening visualized. No sonographic Murphy sign noted by sonographer. Common bile duct: Diameter: 3 mm Liver: No focal lesion identified. Within normal  limits in parenchymal echogenicity. IMPRESSION: Normal right upper quadrant ultrasound. Electronically Signed   By: Marnee SpringJonathon  Watts M.D.   On: 05/17/2016 15:37    Procedures Procedures (including critical care time)  Medications Ordered in ED Medications  sodium chloride 0.9 % bolus 1,000 mL (0 mLs Intravenous Stopped 05/17/16 1542)  ondansetron (ZOFRAN) injection 4 mg (4 mg Intravenous Given 05/17/16 1347)  morphine 4 MG/ML injection 4 mg (4 mg Intravenous Given 05/17/16 1347)  iopamidol (ISOVUE-300) 61 % injection (100 mLs  Contrast Given 05/17/16 1324)     Initial Impression / Assessment and Plan / ED Course  I have reviewed the triage vital signs and the nursing notes.  Pertinent labs & imaging results that were available during my care of the patient were reviewed by me and considered in my medical decision making (see chart for details).    Pt is feeling better.  Pain likely from BV.  She knows to return if worse.  Final Clinical Impressions(s) / ED Diagnoses   Final diagnoses:  Pain  Bacterial vaginosis  Right lower quadrant abdominal pain    New Prescriptions New Prescriptions   HYDROCODONE-ACETAMINOPHEN (NORCO/VICODIN) 5-325 MG TABLET    Take 1 tablet by mouth every 4 (four) hours as needed.   IBUPROFEN (ADVIL,MOTRIN) 600 MG TABLET    Take 1 tablet (600 mg total) by mouth every 6 (six) hours as needed.   METRONIDAZOLE (FLAGYL) 500 MG TABLET    Take 1 tablet (500 mg total) by mouth 2 (two) times daily.    I personally performed the services described in this documentation, which was scribed in my presence. The recorded information has been reviewed and is accurate.     Jacalyn LefevreHaviland, Machel Violante, MD 05/17/16 1544

## 2016-05-17 NOTE — ED Notes (Signed)
Patient transported to Ultrasound 

## 2016-05-17 NOTE — ED Notes (Signed)
Patient transported to CT 

## 2016-05-17 NOTE — ED Triage Notes (Signed)
Pt reports RLQ pain that started yesterday after having diarrhea. Reports nausea, no vomiting and no fever.

## 2016-05-18 LAB — GC/CHLAMYDIA PROBE AMP (~~LOC~~) NOT AT ARMC
Chlamydia: NEGATIVE
Neisseria Gonorrhea: NEGATIVE

## 2017-01-26 ENCOUNTER — Inpatient Hospital Stay (HOSPITAL_COMMUNITY)
Admission: AD | Admit: 2017-01-26 | Discharge: 2017-01-26 | Disposition: A | Discharge status: Home / Self Care | Payer: Medicaid Other | Source: Ambulatory Visit | Attending: Obstetrics and Gynecology | Admitting: Obstetrics and Gynecology

## 2017-01-26 ENCOUNTER — Other Ambulatory Visit: Payer: Self-pay

## 2017-01-26 ENCOUNTER — Encounter (HOSPITAL_COMMUNITY): Payer: Self-pay | Admitting: *Deleted

## 2017-01-26 DIAGNOSIS — Z0371 Encounter for suspected problem with amniotic cavity and membrane ruled out: Secondary | ICD-10-CM

## 2017-01-26 DIAGNOSIS — O26893 Other specified pregnancy related conditions, third trimester: Secondary | ICD-10-CM | POA: Insufficient documentation

## 2017-01-26 DIAGNOSIS — O99333 Smoking (tobacco) complicating pregnancy, third trimester: Secondary | ICD-10-CM | POA: Diagnosis not present

## 2017-01-26 DIAGNOSIS — F17210 Nicotine dependence, cigarettes, uncomplicated: Secondary | ICD-10-CM | POA: Diagnosis not present

## 2017-01-26 DIAGNOSIS — O9989 Other specified diseases and conditions complicating pregnancy, childbirth and the puerperium: Secondary | ICD-10-CM | POA: Diagnosis not present

## 2017-01-26 DIAGNOSIS — O99891 Other specified diseases and conditions complicating pregnancy: Secondary | ICD-10-CM

## 2017-01-26 DIAGNOSIS — N393 Stress incontinence (female) (male): Secondary | ICD-10-CM | POA: Diagnosis not present

## 2017-01-26 DIAGNOSIS — Z3A32 32 weeks gestation of pregnancy: Secondary | ICD-10-CM | POA: Diagnosis not present

## 2017-01-26 LAB — URINALYSIS, ROUTINE W REFLEX MICROSCOPIC
Bilirubin Urine: NEGATIVE
Glucose, UA: NEGATIVE mg/dL
Hgb urine dipstick: NEGATIVE
KETONES UR: NEGATIVE mg/dL
Nitrite: NEGATIVE
PH: 6 (ref 5.0–8.0)
PROTEIN: NEGATIVE mg/dL
Specific Gravity, Urine: 1.014 (ref 1.005–1.030)

## 2017-01-26 LAB — WET PREP, GENITAL
Clue Cells Wet Prep HPF POC: NONE SEEN
Sperm: NONE SEEN
Trich, Wet Prep: NONE SEEN
Yeast Wet Prep HPF POC: NONE SEEN

## 2017-01-26 LAB — AMNISURE RUPTURE OF MEMBRANE (ROM) NOT AT ARMC: Amnisure ROM: NEGATIVE

## 2017-01-26 NOTE — MAU Note (Signed)
They are here to get a 2nd opinion.  [facility], they were not sure if she was ruptured, was told everything is normal.  ? Leaking, still noting wetness.  Is concerned, has poly, has not been told a reason.   Was measuring 40, next day was 42. Concerned something is being missed, wants to make sure all is truly ok.  Had some bleeding on Wed, was told "preterm labor", ft ( 1/2 a cm)

## 2017-01-26 NOTE — MAU Provider Note (Signed)
Chief Complaint:  Rupture of Membranes   First Provider Initiated Contact with Patient 01/26/17 1353      HPI: Anna Decker is a 27 y.o. G4P1001 at [redacted]w[redacted]d who presents to maternity admissions reporting leaking of fluid. Patient receives care at [facility] in [place]. She was recently diagnosed with Polyhydramnios with an AFI of 27.87cm for 32 weeks. She wants a second opinion due to practice being "unsure if she was ruptured yesterday when she was seen". She reports leaking since yesterday and "always being wet", she denies having to wear a pad or towel and denies having a gush of fluid. She reports good fetal movement, she denies vaginal bleeding, vaginal itching/burning, urinary symptoms, h/a, dizziness, n/v, or fever/chills.    Past Medical History: Past Medical History:  Diagnosis Date  . Asthma     Past obstetric history: OB History  Gravida Para Term Preterm AB Living  4 1 1     1   SAB TAB Ectopic Multiple Live Births          1    # Outcome Date GA Lbr Len/2nd Weight Sex Delivery Anes PTL Lv  4 Current           3 Term 2011     Vag-Spont  N LIV  2 Gravida           1 [place]               Past Surgical History: Past Surgical History:  Procedure Laterality Date  . DNC    . TONSILLECTOMY      Family History: History reviewed. No pertinent family history.  Social History: Social History   Tobacco Use  . Smoking status: Current Every Day Smoker    Packs/day: 0.50  . Smokeless tobacco: Never Used  Substance Use Topics  . Alcohol use: No  . Drug use: No    Allergies:  Allergies  Allergen Reactions  . Latex Rash  . Lemon Oil     Meds:  Medications Prior to Admission  Medication Sig Dispense Refill Last Dose  . acetaminophen (TYLENOL) 500 MG tablet Take 1,000 mg by mouth every 6 (six) hours as needed for mild pain.   08/18/2015 at Unknown time  . famotidine (PEPCID) 20 MG tablet Take 1 tablet (20 mg total) by mouth 2 (two) times daily. (Patient  not taking: Reported on 08/19/2015) 30 tablet 0 Not Taking at Unknown time  . HYDROcodone-acetaminophen (NORCO/VICODIN) 5-325 MG tablet Take 1 tablet by mouth every 4 (four) hours as needed. 10 tablet 0   . ibuprofen (ADVIL,MOTRIN) 600 MG tablet Take 1 tablet (600 mg total) by mouth every 6 (six) hours as needed. 30 tablet 0   . ipratropium (ATROVENT) 0.06 % nasal spray Place 2 sprays into both nostrils 4 (four) times daily. (Patient not taking: Reported on 08/19/2015) 15 mL 0 Not Taking at Unknown time  . metroNIDAZOLE (FLAGYL) 500 MG tablet Take 1 tablet (500 mg total) by mouth 2 (two) times daily. 14 tablet 0     ROS:  Review of Systems  Constitutional: Negative.   Respiratory: Negative.   Cardiovascular: Negative.   Gastrointestinal: Negative.   Genitourinary: Positive for vaginal discharge. Negative for difficulty urinating, dysuria, flank pain, frequency, hematuria, pelvic pain, urgency, vaginal bleeding and vaginal pain.  Musculoskeletal: Negative.   Neurological: Negative.   Psychiatric/Behavioral: Negative.    I have reviewed patient's Past Medical Hx, Surgical Hx, Family Hx, Social Hx, medications and allergies.   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  Cardiovascular: normal rate Respiratory: normal effort GI: Abd soft, non-tender, 41cm fundal height with is consistent with findings on 1/23 of 42cm FH. MS: Extremities nontender, no edema, normal ROM Neurologic: Alert and oriented x 4.  GU: Neg CVAT.  PELVIC EXAM: Cervix pink, visually closed, without lesion, moderate white creamy discharge, vaginal walls and  external genitalia normal Cervical Exam: Dilation: Fingertip Effacement (%): Thick Cervical Position: Posterior Exam by:: Lanice ShirtsV Narya Beavin CNM  FHT:  Baseline 140 , moderate variability, accelerations present, no decelerations Contractions: 2 UC over the course of 2 hours    Labs: Results for orders placed or performed during the hospital encounter of 01/26/17 (from the past 24 hour(s))  Urinalysis, Routine w reflex microscopic     Status: Abnormal   Collection Time: 01/26/17  1:27 PM  Result Value Ref Range   Color, Urine YELLOW YELLOW   APPearance CLOUDY (A) CLEAR   Specific Gravity, Urine 1.014 1.005 - 1.030   pH 6.0 5.0 - 8.0   Glucose, UA NEGATIVE NEGATIVE mg/dL   Hgb urine dipstick NEGATIVE NEGATIVE   Bilirubin Urine NEGATIVE NEGATIVE   Ketones, ur NEGATIVE NEGATIVE mg/dL   Protein, ur NEGATIVE NEGATIVE mg/dL   Nitrite NEGATIVE NEGATIVE   Leukocytes, UA TRACE (A) NEGATIVE   RBC / HPF 0-5 0 - 5 RBC/hpf   WBC, UA 0-5 0 - 5 WBC/hpf   Bacteria, UA FEW (A) NONE SEEN   Squamous Epithelial / LPF TOO NUMEROUS TO COUNT (A) NONE SEEN   Mucus PRESENT   Wet prep, genital     Status: Abnormal   Collection Time: 01/26/17  2:10 PM  Result Value Ref Range   Yeast Wet Prep HPF POC NONE SEEN NONE SEEN   Trich, Wet Prep NONE SEEN NONE SEEN   Clue Cells Wet Prep HPF POC NONE SEEN NONE SEEN   WBC, Wet Prep HPF POC FEW (A) NONE SEEN   Sperm NONE SEEN   Am[facility] rupture [facility] membrane [facility]rom[facility]     Status: None   Collection Time: 01/26/17  2:10 PM  Result Value Ref Range   Amnisure ROM NEGATIVE     MAU Course/MDM: Orders Placed This Encounter  Procedures  . Wet prep, genital  . Urinalysis, Routine w reflex microscopic  . Am[facility]   NST reviewed Pt discharge with preterm labor precautions. Educated on rupture of fluids and what pt can expect with dx of poly. Discussed importance of following up as scheduled with office she plans to deliver with.    Today's evaluation included a work-up for preterm labor which can be life-threatening for both mom and baby.  Assessment: 1. No leakage of amniotic fluid into vagina   2. Stress incontinence during pregnancy    Plan: Discharge home Preterm Labor precautions and fetal kick counts Follow up as scheduled in office with [facility]    Allergies as of 01/26/2017      Reactions  Latex Rash   Lemon Oil       Medication List    STOP taking these medications   HYDROcodone-acetaminophen 5-325 MG tablet Commonly known as:  NORCO/VICODIN   ibuprofen 600 MG tablet Commonly known as:  ADVIL,MOTRIN   ipratropium 0.06 % nasal spray Commonly known as:  ATROVENT     TAKE these medications   acetaminophen 500 MG tablet Commonly known as:  TYLENOL Take 1,000 mg by mouth every 6 (six) hours as needed for mild pain.   famotidine 20 MG tablet Commonly known as:  PEPCID Take 1 tablet (20 mg total) by mouth 2 (two) times daily.   metroNIDAZOLE 500 MG tablet Commonly known as:  FLAGYL Take 1 tablet (500 mg total) by mouth 2 (two) times daily.       Steward Drone Certified Nurse-Midwife 01/26/2017 2:17 PM

## 2017-02-14 ENCOUNTER — Other Ambulatory Visit: Payer: Self-pay

## 2017-02-14 ENCOUNTER — Inpatient Hospital Stay (HOSPITAL_COMMUNITY)
Admission: AD | Admit: 2017-02-14 | Discharge: 2017-02-14 | Disposition: A | Discharge status: Home / Self Care | Payer: Medicaid Other | Source: Ambulatory Visit | Attending: Obstetrics and Gynecology | Admitting: Obstetrics and Gynecology

## 2017-02-14 DIAGNOSIS — Z3A35 35 weeks gestation of pregnancy: Secondary | ICD-10-CM | POA: Diagnosis not present

## 2017-02-14 DIAGNOSIS — F17210 Nicotine dependence, cigarettes, uncomplicated: Secondary | ICD-10-CM | POA: Diagnosis not present

## 2017-02-14 DIAGNOSIS — O133 Gestational [pregnancy-induced] hypertension without significant proteinuria, third trimester: Secondary | ICD-10-CM | POA: Diagnosis not present

## 2017-02-14 DIAGNOSIS — O99333 Smoking (tobacco) complicating pregnancy, third trimester: Secondary | ICD-10-CM | POA: Insufficient documentation

## 2017-02-14 DIAGNOSIS — R102 Pelvic and perineal pain unspecified side: Secondary | ICD-10-CM

## 2017-02-14 DIAGNOSIS — O26893 Other specified pregnancy related conditions, third trimester: Secondary | ICD-10-CM | POA: Insufficient documentation

## 2017-02-14 DIAGNOSIS — N9489 Other specified conditions associated with female genital organs and menstrual cycle: Secondary | ICD-10-CM

## 2017-02-14 LAB — URINALYSIS, ROUTINE W REFLEX MICROSCOPIC
Bilirubin Urine: NEGATIVE
Glucose, UA: NEGATIVE mg/dL
Hgb urine dipstick: NEGATIVE
Ketones, ur: NEGATIVE mg/dL
Nitrite: NEGATIVE
Protein, ur: NEGATIVE mg/dL
Specific Gravity, Urine: 1.005 (ref 1.005–1.030)
pH: 7 (ref 5.0–8.0)

## 2017-02-14 NOTE — MAU Provider Note (Signed)
.  History  CSN: [id number] Arrival date and time: 02/14/17 1811  First Provider Initiated Contact with Patient 02/14/17 1902      Chief Complaint  Patient presents with  . Vaginal Pain    HPI: Anna Decker is a 27 y.o. G4P1001 with IUP at 2138w4d who presents to maternity admissions reporting vaginal pain.  The patient reports that she woke up this morning at 2 Am wth significant pain in her vagina.  She tried to walk around and  reposition to relieve it but nothing helped.  She reports no itching, bleeding, or general erythema.  Patient notes that she last had intercourse last night but does not believe there is any correlation.    Denies frequent contractions, leakage of fluid or vaginal bleeding. Good fetal movement.  Also denies any abnormal vaginal discharge, vaginal bleeding, fevers, chills, malaise, dysuria, hematuria, urinary frequency, nausea, vomiting, diarrhea, RUQ/epigastric pain, dizziness/lighreadhess, or headache.   [facility] Pregnancy complicated by gestational hypertension to be induced at 37 weeks.    OB History  Gravida Para Term Preterm AB Living  4 1 1     1   SAB TAB Ectopic Multiple Live Births          1    # Outcome Date GA Lbr Len/2nd Weight Sex Delivery Anes PTL Lv  4 Current           3 Term 2011     Vag-Spont  N LIV  2 Gravida           1 [place]Gravida              Past Medical History:  Diagnosis Date  . Asthma    Past Surgical History:  Procedure Laterality Date  . DNC    . TONSILLECTOMY     No family history on file. Social History   Socioeconomic History  . Marital status: Legally Separated    Spouse name: Not on file  . Number of children: Not on file  . Years of education: Not on file  . Highest education level: Not on file  Social Needs  . Financial resource strain: Not on file  . Food insecurity - worry: Not on file  . Food insecurity - inability: Not on file  . Transportation needs - medical: Not on file   . Transportation needs - non-medical: Not on file  Occupational History  . Not on file  Tobacco Use  . Smoking status: Current Every Day Smoker    Packs/day: 0.50  . Smokeless tobacco: Never Used  Substance and Sexual Activity  . Alcohol use: No  . Drug use: No  . Sexual activity: Yes    Birth control/protection: None  Other Topics Concern  . Not on file  Social History Narrative  . Not on file   Allergies  Allergen Reactions  . Latex Rash  . Lemon Oil     Medications Prior to Admission  Medication Sig Dispense Refill Last Dose  . acetaminophen (TYLENOL) 500 MG tablet Take 1,000 mg by mouth every 6 (six) hours as needed for mild pain.   08/18/2015 at Unknown time  . famotidine (PEPCID) 20 MG tablet Take 1 tablet (20 mg total) by mouth 2 (two) times daily. (Patient not taking: Reported on 08/19/2015) 30 tablet 0 Not Taking at Unknown time  . metroNIDAZOLE (FLAGYL) 500 MG tablet Take 1 tablet (500 mg total) by mouth 2 (two) times daily. 14 tablet 0  I have reviewed patient's Past Medical Hx, Surgical Hx, Family Hx, Social Hx, medications and allergies.   Review of Systems: Negative except for what is mentioned in HPI.  Physical Exam   Blood pressure 133/68, pulse 95, temperature 97.8 F (36.6 C), temperature source Oral, resp. rate 18, weight 93.2 kg (205 lb 8 oz), last menstrual period 05/10/2016, unknown if currently breastfeeding.  Constitutional: Well-developed, well-nourished female in no acute distress.  HENT: [place]/AT, normal oropharynx mucosa. MMM Eyes: normal conjunctivae, no scleral icterus Cardiovascular: normal rate, regular rhythm Respiratory: normal effort, lungs CTAB.  GI: Abd soft, non-tender, gravid appropriate for gestational age.   GU: Neg CVAT. SVE: Peripheral exam is appreciable for swollen and tender right labia.  closed cervix, no CMT, no palpable adnexal mass.   MSK: Extremities nontender, no edema Neurologic: Alert and oriented x 4. Psych:  Normal mood and affect Skin: warm and dry   FHT:  Baseline 145 , moderate variability, accelerations present, no decelerations Toco: Contractions: infrequent  MAU Course/MDM:   Nursing notes and VS reviewed. Patient seen and examined, as noted above.  Initial elevated BP but repeats found to be within normal range   Results reviewed:  Results for orders placed or performed during the hospital encounter of 02/14/17  Urinalysis, Routine w reflex microscopic  Result Value Ref Range   Color, Urine YELLOW YELLOW   APPearance HAZY (A) CLEAR   Specific Gravity, Urine 1.005 1.005 - 1.030   pH 7.0 5.0 - 8.0   Glucose, UA NEGATIVE NEGATIVE mg/dL   Hgb urine dipstick NEGATIVE NEGATIVE   Bilirubin Urine NEGATIVE NEGATIVE   Ketones, ur NEGATIVE NEGATIVE mg/dL   Protein, ur NEGATIVE NEGATIVE mg/dL   Nitrite NEGATIVE NEGATIVE   Leukocytes, UA LARGE (A) NEGATIVE   RBC / HPF 0-5 0 - 5 RBC/hpf   WBC, UA 6-30 0 - 5 WBC/hpf   Bacteria, UA RARE (A) NONE SEEN   Squamous Epithelial / LPF 6-30 (A) NONE SEEN   Mucus PRESENT    Budding Yeast PRESENT      Assessment and Plan  Assessment: 1. Swelling of labia   2. Vaginal pain   3. [redacted] weeks gestation of pregnancy   4. Gestational hypertension, third trimester     Plan: --advised patient on supportive measures to control swelling related to pregnancy  --Discharge home in stable condition.  --Labor precautions and fetal kick counts     Ames Coupe, Medical Student 02/14/2017 7:27 PM   I confirm that I have verified the information documented in the medical student's note and that I have also personally reperformed the physical exam and all medical decision making activities.  Rolm Bookbinder, CNM 02/14/17 8:05 PM

## 2017-02-14 NOTE — MAU Note (Signed)
Pain/pressure in vagina, started last night.  Getting worse.  Denies bleeding.  ? wetness

## 2017-02-14 NOTE — MAU Note (Signed)
Pt is G4P1 at 35.4 weeks c/o constant vaginal pressure.  No LOF, positive FM, no VB.  Small swollen spot on labia noted while assessing for LOF.  Pt states it is tender, and feels swollen.  Possible LGA vs poly.

## 2017-02-18 ENCOUNTER — Other Ambulatory Visit: Payer: Self-pay

## 2017-02-18 ENCOUNTER — Encounter (HOSPITAL_COMMUNITY): Payer: Self-pay

## 2017-02-18 ENCOUNTER — Inpatient Hospital Stay (HOSPITAL_COMMUNITY)
Admission: AD | Admit: 2017-02-18 | Discharge: 2017-02-18 | Disposition: A | Discharge status: Home / Self Care | Payer: Medicaid Other | Source: Ambulatory Visit | Attending: Family Medicine | Admitting: Family Medicine

## 2017-02-18 DIAGNOSIS — Z87891 Personal history of nicotine dependence: Secondary | ICD-10-CM | POA: Insufficient documentation

## 2017-02-18 DIAGNOSIS — Z3689 Encounter for other specified antenatal screening: Secondary | ICD-10-CM

## 2017-02-18 DIAGNOSIS — O4703 False labor before 37 completed weeks of gestation, third trimester: Secondary | ICD-10-CM

## 2017-02-18 DIAGNOSIS — Z91040 Latex allergy status: Secondary | ICD-10-CM | POA: Insufficient documentation

## 2017-02-18 DIAGNOSIS — R109 Unspecified abdominal pain: Secondary | ICD-10-CM | POA: Diagnosis present

## 2017-02-18 DIAGNOSIS — Z79899 Other long term (current) drug therapy: Secondary | ICD-10-CM | POA: Insufficient documentation

## 2017-02-18 DIAGNOSIS — O133 Gestational [pregnancy-induced] hypertension without significant proteinuria, third trimester: Secondary | ICD-10-CM

## 2017-02-18 DIAGNOSIS — Z3A36 36 weeks gestation of pregnancy: Secondary | ICD-10-CM

## 2017-02-18 HISTORY — DX: Essential (primary) hypertension: I10

## 2017-02-18 LAB — COMPREHENSIVE METABOLIC PANEL
ALT: 14 U/L (ref 14–54)
AST: 14 U/L — AB (ref 15–41)
Albumin: 2.9 g/dL — ABNORMAL LOW (ref 3.5–5.0)
Alkaline Phosphatase: 141 U/L — ABNORMAL HIGH (ref 38–126)
Anion gap: 9 (ref 5–15)
BILIRUBIN TOTAL: 0.5 mg/dL (ref 0.3–1.2)
BUN: 9 mg/dL (ref 6–20)
CHLORIDE: 108 mmol/L (ref 101–111)
CO2: 18 mmol/L — ABNORMAL LOW (ref 22–32)
Calcium: 8.7 mg/dL — ABNORMAL LOW (ref 8.9–10.3)
Creatinine, Ser: 0.5 mg/dL (ref 0.44–1.00)
GFR calc Af Amer: >60 mL/min (ref >60)
Glucose, Bld: 102 mg/dL — ABNORMAL HIGH (ref 65–99)
Potassium: 3.6 mmol/L (ref 3.5–5.1)
Sodium: 135 mmol/L (ref 135–145)
Total Protein: 7 g/dL (ref 6.5–8.1)

## 2017-02-18 LAB — PROTEIN / CREATININE RATIO, URINE
CREATININE, URINE: 90 mg/dL
Protein Creatinine Ratio: 0.09 mg/mg{Cre} (ref 0.00–0.15)
Total Protein, Urine: 8 mg/dL

## 2017-02-18 LAB — CBC
HEMATOCRIT: 33 % — AB (ref 36.0–46.0)
HEMOGLOBIN: 11 g/dL — AB (ref 12.0–15.0)
MCH: 26.4 pg (ref 26.0–34.0)
MCHC: 33.3 g/dL (ref 30.0–36.0)
MCV: 79.3 fL (ref 78.0–100.0)
PLATELETS: 337 10*3/uL (ref 150–400)
RBC: 4.16 MIL/uL (ref 3.87–5.11)
RDW: 12.7 % (ref 11.5–15.5)
WBC: 11.2 10*3/uL — AB (ref 4.0–10.5)

## 2017-02-18 MED ORDER — ACETAMINOPHEN 500 MG PO TABS
1000.0000 mg | ORAL_TABLET | Freq: Four times a day (QID) | ORAL | Status: DC | PRN
  Administered 2017-02-18: 1000 mg via ORAL
  Filled 2017-02-18: qty 2

## 2017-02-18 NOTE — Discharge Instructions (Signed)
Braxton Hicks Contractions °Contractions of the uterus can occur throughout pregnancy, but they are not always a sign that you are in labor. You may have practice contractions called Braxton Hicks contractions. These false labor contractions are sometimes confused with true labor. °What are Braxton Hicks contractions? °Braxton Hicks contractions are tightening movements that occur in the muscles of the uterus before labor. Unlike true labor contractions, these contractions do not result in opening (dilation) and thinning of the cervix. Toward the end of pregnancy (32-34 weeks), Braxton Hicks contractions can happen more often and may become stronger. These contractions are sometimes difficult to tell apart from true labor because they can be very uncomfortable. You should not feel embarrassed if you go to the hospital with false labor. °Sometimes, the only way to tell if you are in true labor is for your health care provider to look for changes in the cervix. The health care provider will do a physical exam and may monitor your contractions. If you are not in true labor, the exam should show that your cervix is not dilating and your water has not broken. °If there are other health problems associated with your pregnancy, it is completely safe for you to be sent home with false labor. You may continue to have Braxton Hicks contractions until you go into true labor. °How to tell the difference between true labor and false labor °True labor °· Contractions last 30-70 seconds. °· Contractions become very regular. °· Discomfort is usually felt in the top of the uterus, and it spreads to the lower abdomen and low back. °· Contractions do not go away with walking. °· Contractions usually become more intense and increase in frequency. °· The cervix dilates and gets thinner. °False labor °· Contractions are usually shorter and not as strong as true labor contractions. °· Contractions are usually irregular. °· Contractions  are often felt in the front of the lower abdomen and in the groin. °· Contractions may go away when you walk around or change positions while lying down. °· Contractions get weaker and are shorter-lasting as time goes on. °· The cervix usually does not dilate or become thin. °Follow these instructions at home: °· Take over-the-counter and prescription medicines only as told by your health care provider. °· Keep up with your usual exercises and follow other instructions from your health care provider. °· Eat and drink lightly if you think you are going into labor. °· If Braxton Hicks contractions are making you uncomfortable: °? Change your position from lying down or resting to walking, or change from walking to resting. °? Sit and rest in a tub of warm water. °? Drink enough fluid to keep your urine pale yellow. Dehydration may cause these contractions. °? Do slow and deep breathing several times an hour. °· Keep all follow-up prenatal visits as told by your health care provider. This is important. °Contact a health care provider if: °· You have a fever. °· You have continuous pain in your abdomen. °Get help right away if: °· Your contractions become stronger, more regular, and closer together. °· You have fluid leaking or gushing from your vagina. °· You pass blood-tinged mucus (bloody show). °· You have bleeding from your vagina. °· You have low back pain that you never had before. °· You feel your baby’s head pushing down and causing pelvic pressure. °· Your baby is not moving inside you as much as it used to. °Summary °· Contractions that occur before labor are called Braxton   Hicks contractions, false labor, or practice contractions.  Braxton Hicks contractions are usually shorter, weaker, farther apart, and less regular than true labor contractions. True labor contractions usually become progressively stronger and regular and they become more frequent.  Manage discomfort from [facility] contractions by  changing position, resting in a warm bath, drinking plenty of water, or practicing deep breathing. This information is not intended to replace advice given to you by your health care provider. Make sure you discuss any questions you have with your health care provider. Document Released: 05/04/2016 Document Revised: 05/04/2016 Document Reviewed: 05/04/2016 Elsevier Interactive Patient Education  2018 [employer] Hypertension During Pregnancy Hypertension is also called high blood pressure. High blood pressure means that the force of your blood moving in your body is too strong. When you are pregnant, this condition should be watched carefully. It can cause problems for you and your baby. Follow these instructions at home: Eating and drinking  Drink enough fluid to keep your pee (urine) clear or pale yellow.  Eat healthy foods that are low in salt (sodium). ? Do not add salt to your food. ? Check labels on foods and drinks to see much salt is in them. Look on the label where you see "Sodium." Lifestyle  Do not use any products that contain nicotine or tobacco, such as cigarettes and e-cigarettes. If you need help quitting, ask your doctor.  Do not use alcohol.  Avoid caffeine.  Avoid stress. Rest and get plenty of sleep. General instructions  Take over-the-counter and prescription medicines only as told by your doctor.  While lying down, lie on your left side. This keeps pressure off your baby.  While sitting or lying down, raise (elevate) your feet. Try putting some pillows under your lower legs.  Exercise regularly. Ask your doctor what kinds of exercise are best for you.  Keep all prenatal and follow-up visits as told by your doctor. This is important. Contact a doctor if:  You have symptoms that your doctor told you to watch for, such as: ? Fever. ? Throwing up (vomiting). ? Headache. Get help right away if:  You have very bad pain in your belly (abdomen).  You are  throwing up, and this does not get better with treatment.  You suddenly get swelling in your hands, ankles, or face.  You gain 4 lb (1.8 kg) or more in 1 week.  You get bleeding from your vagina.  You have blood in your pee.  You do not feel your baby moving as much as normal.  You have a change in vision.  You have muscle twitching or sudden tightening (spasms).  You have trouble breathing.  Your lips or fingernails turn blue. This information is not intended to replace advice given to you by your health care provider. Make sure you discuss any questions you have with your health care provider. Document Released: 01/21/2010 Document Revised: 08/31/2015 Document Reviewed: 08/31/2015 Elsevier Interactive Patient Education  [employer]2018 [employer]

## 2017-02-18 NOTE — MAU Note (Signed)
Pt states UCs began this am approx 0400 & have intermittently gotten stronger & more frequent accompanied with nausea.  Denies vag bleeding, LOF.  States +FM.  Pt states she has had HTN with this pregnancy.

## 2017-02-18 NOTE — MAU Provider Note (Signed)
History     CSN: [id number]  Arrival date and time: 02/18/17 1558   First Provider Initiated Contact with Patient 02/18/17 1634      Chief Complaint  Patient presents with  . Contractions   [id number] @36 .1 wks here with ctx. Reports ctx started this am. Unsure of frequency. No VB or LOF. Reports good FM. Had IC last night. HA today. Rates 3/10. Took Tylenol this am and helped. Denies visual disturbances and epigastric pain. Pregnancy is complicated by GHTN. [place]care [place]     OB History    Gravida Para Term Preterm AB Living   4 1 1   2 1    SAB TAB Ectopic Multiple Live Births   2       1      Past Medical History:  Diagnosis Date  . Asthma   . Hypertension    with this pregnancy    Past Surgical History:  Procedure Laterality Date  . DILATION AND CURETTAGE OF UTERUS    . DNC    . TONSILLECTOMY      No family history on file.  Social History   Tobacco Use  . Smoking status: Former Smoker    Packs/day: 0.50  . Smokeless tobacco: Never Used  Substance Use Topics  . Alcohol use: No  . Drug use: No    Allergies:  Allergies  Allergen Reactions  . Latex Rash  . Lemon Oil Swelling    Swelling of the tongue.    Medications Prior to Admission  Medication Sig Dispense Refill Last Dose  . acetaminophen (TYLENOL) 500 MG tablet Take 1,000 mg by mouth every 6 (six) hours as needed for mild pain or headache.    02/17/2017 at Unknown time  . albuterol (PROVENTIL HFA;VENTOLIN HFA) 108 (90 Base) MCG/ACT inhaler Inhale 2 puffs into the lungs every 6 (six) hours as needed for wheezing or shortness of breath.   Rescue  . omeprazole (PRILOSEC) 20 MG capsule Take 20 mg by mouth daily.   02/17/2017 at Unknown time  . Prenatal Vit-Fe Fumarate-FA (PRENATAL MULTIVITAMIN) TABS tablet Take 1 tablet by mouth at bedtime.   02/17/2017 at Unknown time  . ranitidine (ZANTAC) 150 MG tablet Take 150 mg by mouth 2 (two) times daily as needed for  heartburn.   Past Month at Unknown time    Review of Systems  Eyes: Negative for visual disturbance.  Gastrointestinal: Positive for abdominal pain (ctx).  Genitourinary: Negative for vaginal bleeding.  Neurological: Positive for headaches.   Physical Exam   Blood pressure 125/84, pulse (!) 127, temperature 97.9 F (36.6 C), temperature source Oral, resp. rate 20, height 5\' 3"  (1.6 m), weight 210 lb 8 oz (95.5 kg), last menstrual period 05/10/2016, unknown if currently breastfeeding. Patient Vitals for the past 24 hrs:  BP Temp Temp src Pulse Resp Height Weight  02/18/17 1730 125/84 - - (!) 127 - - -  02/18/17 1727 129/89 - - (!) 102 - - -  02/18/17 1645 (!) 148/93 - - (!) 111 - - -  02/18/17 1637 - - - - - 5\' 3"  (1.6 m) 210 lb 8 oz (95.5 kg)  02/18/17 1631 (!) 143/81 - - (!) 110 - - -  02/18/17 1611 (!) 151/88 97.9 F (36.6 C) Oral (!) 102 20 - -    Physical Exam  Constitutional: She is oriented to person, place, and time. She appears well-developed and well-nourished. No distress.  HENT:  Head: Normocephalic  and atraumatic.  Neck: Normal range of motion.  Respiratory: Effort normal. No respiratory distress.  GI: Soft. She exhibits no distension. There is no tenderness.  gravid  Genitourinary:  Genitourinary Comments: SVE FT per RN  Musculoskeletal: Normal range of motion.  Neurological: She is alert and oriented to person, place, and time.  Skin: Skin is warm and dry.  Psychiatric: She has a normal mood and affect.  EFM: 145 bpm, mod variability, + accels, no decels Toco: irregular  Results for orders placed or performed during the hospital encounter of 02/18/17 (from the past 24 hour(s))  Protein / creatinine ratio, urine     Status: None   Collection Time: 02/18/17  4:55 PM  Result Value Ref Range   Creatinine, Urine 90.00 mg/dL   Total Protein, Urine 8 mg/dL   Protein Creatinine Ratio 0.09 0.00 - 0.15 mg/mg[Cre]  CBC     Status: Abnormal   Collection Time:  02/18/17  5:04 PM  Result Value Ref Range   WBC 11.2 (H) 4.0 - 10.5 K/uL   RBC 4.16 3.87 - 5.11 MIL/uL   Hemoglobin 11.0 (L) 12.0 - 15.0 g/dL   HCT 16.1 (L) 09.6 - 04.5 %   MCV 79.3 78.0 - 100.0 fL   MCH 26.4 26.0 - 34.0 pg   MCHC 33.3 30.0 - 36.0 g/dL   RDW 40.9 81.1 - 91.4 %   Platelets 337 150 - 400 K/uL  Comprehensive metabolic panel     Status: Abnormal   Collection Time: 02/18/17  5:04 PM  Result Value Ref Range   Sodium 135 135 - 145 mmol/L   Potassium 3.6 3.5 - 5.1 mmol/L   Chloride 108 101 - 111 mmol/L   CO2 18 (L) 22 - 32 mmol/L   Glucose, Bld 102 (H) 65 - 99 mg/dL   BUN 9 6 - 20 mg/dL   Creatinine, Ser 7.82 0.44 - 1.00 mg/dL   Calcium 8.7 (L) 8.9 - 10.3 mg/dL   Total Protein 7.0 6.5 - 8.1 g/dL   Albumin 2.9 (L) 3.5 - 5.0 g/dL   AST 14 (L) 15 - 41 U/L   ALT 14 14 - 54 U/L   Alkaline Phosphatase 141 (H) 38 - 126 U/L   Total Bilirubin 0.5 0.3 - 1.2 mg/dL   GFR calc non Af Amer >60 >60 mL/min   GFR calc Af Amer >60 >60 mL/min   Anion gap 9 5 - 15   MAU Course  Procedures Tylenol  MDM Labs ordered and reviewed. HA improved. No evidence of pre-e. Cervix unchanged. Stable for discharge.   Assessment and Plan   1. [redacted] weeks gestation of pregnancy   2. NST (non-stress test) reactive   3. Gestational hypertension, third trimester    Discharge home Follow up in [facility] office tomorrow as scheduled PTL precautions Pre-e precautions  Allergies as of 02/18/2017      Reactions   Latex Rash   Lemon Oil Swelling   Swelling of the tongue.      Medication List    TAKE these medications   acetaminophen 500 MG tablet Commonly known as:  TYLENOL Take 1,000 mg by mouth every 6 (six) hours as needed for mild pain or headache.   albuterol 108 (90 Base) MCG/ACT inhaler Commonly known as:  PROVENTIL HFA;VENTOLIN HFA Inhale 2 puffs into the lungs every 6 (six) hours as needed for wheezing or shortness of breath.   omeprazole 20 MG capsule Commonly known as:   PRILOSEC Take 20  mg by mouth daily.   prenatal multivitamin Tabs tablet Take 1 tablet by mouth at bedtime.   ranitidine 150 MG tablet Commonly known as:  ZANTAC Take 150 mg by mouth 2 (two) times daily as needed for heartburn.      Donette Larry, CNM 02/18/2017, 5:58 PM

## 2017-04-13 ENCOUNTER — Encounter (HOSPITAL_COMMUNITY): Payer: Self-pay

## 2017-04-13 ENCOUNTER — Emergency Department (HOSPITAL_COMMUNITY)
Admission: EM | Admit: 2017-04-13 | Discharge: 2017-04-13 | Disposition: A | Discharge status: Home / Self Care | Payer: Medicaid Other | Attending: Emergency Medicine | Admitting: Emergency Medicine

## 2017-04-13 ENCOUNTER — Emergency Department (HOSPITAL_COMMUNITY): Payer: Medicaid Other

## 2017-04-13 ENCOUNTER — Other Ambulatory Visit: Payer: Self-pay

## 2017-04-13 DIAGNOSIS — J45909 Unspecified asthma, uncomplicated: Secondary | ICD-10-CM | POA: Diagnosis not present

## 2017-04-13 DIAGNOSIS — R1013 Epigastric pain: Secondary | ICD-10-CM | POA: Diagnosis present

## 2017-04-13 DIAGNOSIS — Z79899 Other long term (current) drug therapy: Secondary | ICD-10-CM | POA: Diagnosis not present

## 2017-04-13 DIAGNOSIS — Z87891 Personal history of nicotine dependence: Secondary | ICD-10-CM | POA: Diagnosis not present

## 2017-04-13 DIAGNOSIS — K8050 Calculus of bile duct without cholangitis or cholecystitis without obstruction: Secondary | ICD-10-CM

## 2017-04-13 DIAGNOSIS — Z91040 Latex allergy status: Secondary | ICD-10-CM | POA: Diagnosis not present

## 2017-04-13 LAB — COMPREHENSIVE METABOLIC PANEL
ALK PHOS: 113 U/L (ref 38–126)
ALT: 26 U/L (ref 14–54)
AST: 25 U/L (ref 15–41)
Albumin: 4.1 g/dL (ref 3.5–5.0)
Anion gap: 9 (ref 5–15)
BILIRUBIN TOTAL: 0.5 mg/dL (ref 0.3–1.2)
BUN: 11 mg/dL (ref 6–20)
CALCIUM: 9.2 mg/dL (ref 8.9–10.3)
CHLORIDE: 108 mmol/L (ref 101–111)
CO2: 24 mmol/L (ref 22–32)
CREATININE: 0.72 mg/dL (ref 0.44–1.00)
Glucose, Bld: 106 mg/dL — ABNORMAL HIGH (ref 65–99)
Potassium: 4.4 mmol/L (ref 3.5–5.1)
Sodium: 141 mmol/L (ref 135–145)
Total Protein: 7.1 g/dL (ref 6.5–8.1)

## 2017-04-13 LAB — I-STAT BETA HCG BLOOD, ED (MC, WL, AP ONLY)

## 2017-04-13 LAB — CBC
HCT: 39.6 % (ref 36.0–46.0)
Hemoglobin: 12.8 g/dL (ref 12.0–15.0)
MCH: 25.8 pg — ABNORMAL LOW (ref 26.0–34.0)
MCHC: 32.3 g/dL (ref 30.0–36.0)
MCV: 79.7 fL (ref 78.0–100.0)
PLATELETS: 376 10*3/uL (ref 150–400)
RBC: 4.97 MIL/uL (ref 3.87–5.11)
RDW: 14.1 % (ref 11.5–15.5)
WBC: 13 10*3/uL — AB (ref 4.0–10.5)

## 2017-04-13 LAB — LIPASE, BLOOD: LIPASE: 26 U/L (ref 11–51)

## 2017-04-13 MED ORDER — HYDROCODONE-ACETAMINOPHEN 5-325 MG PO TABS: 1.0000 | ORAL_TABLET | Freq: Four times a day (QID) | ORAL | 0 refills | Status: DC | PRN

## 2017-04-13 MED ORDER — ONDANSETRON 4 MG PO TBDP
4.0000 mg | ORAL_TABLET | Freq: Once | ORAL | Status: AC | PRN
  Administered 2017-04-13: 4 mg via ORAL
  Filled 2017-04-13: qty 1

## 2017-04-13 MED ORDER — ONDANSETRON 8 MG PO TBDP: 8.0000 mg | ORAL_TABLET | Freq: Three times a day (TID) | ORAL | 0 refills | Status: DC | PRN

## 2017-04-13 MED ORDER — GI COCKTAIL ~~LOC~~
30.0000 mL | Freq: Once | ORAL | Status: AC
Start: 2017-04-13 — End: 2017-04-13
  Administered 2017-04-13: 30 mL via ORAL
  Filled 2017-04-13: qty 30

## 2017-04-13 NOTE — ED Provider Notes (Signed)
[facility] EMERGENCY [facility]PARTMENT Provider Note   CSN: [id number] Arrival date & time: 04/13/17  1648     History   Chief Complaint Chief Complaint  Patient presents with  . Abdominal Pain    HPI Anna Decker is a 27 y.o. female.  Patient approximately 7 weeks post partum, c/o intermittent upper abd pain that began even prior to delivery. Pain intermittent, seems to occur randomly, not related to eating, lasts several minutes up to 1 hr at a time. Pain is sudden onset, moderate-sev, dull, occasionally radiates to back. No hx gallstones. +fam hx gallstones. No chest pain. No sob. Is having normal bms. Episode of nv today, not bloody or bilious. Is not breast feeding. n ofevers.   The history is provided by the patient.  Abdominal Pain   Associated symptoms include vomiting. Pertinent negatives include fever, dysuria and headaches.    Past Medical History:  Diagnosis Date  . Asthma   . Hypertension    with this pregnancy    There are no active problems to display for this patient.   Past Surgical History:  Procedure Laterality Date  . DILATION AND CURETTAGE OF UTERUS    . DNC    . TONSILLECTOMY       OB History    Gravida  4   Para  1   Term  1   Preterm      AB  2   Living  1     SAB  2   TAB      Ectopic      Multiple      Live Births  1            Home Medications    Prior to Admission medications   Medication Sig Start Date End Date Taking? Authorizing Provider  acetaminophen (TYLENOL) 500 MG tablet Take 1,000 mg by mouth every 6 (six) hours as needed for mild pain or headache.     [provider]  albuterol (PROVENTIL HFA;VENTOLIN HFA) 108 (90 Base) MCG/ACT inhaler Inhale 2 puffs into the lungs every 6 (six) hours as needed for wheezing or shortness of breath.    [provider]  omeprazole (PRILOSEC) 20 MG capsule Take 20 mg by mouth daily.    [provider]  Prenatal Vit-Fe  Fumarate-FA (PRENATAL MULTIVITAMIN) TABS tablet Take 1 tablet by mouth at bedtime.    [provider]  ranitidine (ZANTAC) 150 MG tablet Take 150 mg by mouth 2 (two) times daily as needed for heartburn.    [provider]    Family History History reviewed. No pertinent family history.  Social History Social History   Tobacco Use  . Smoking status: Former Smoker    Packs/day: 0.50  . Smokeless tobacco: Never Used  Substance Use Topics  . Alcohol use: Yes    Comment: OCC  . Drug use: No     Allergies   Latex and Lemon oil   Review of Systems Review of Systems  Constitutional: Negative for chills and fever.  HENT: Negative for sore throat.   Eyes: Negative for redness.  Respiratory: Negative for cough and shortness of breath.   Cardiovascular: Negative for chest pain.  Gastrointestinal: Positive for abdominal pain and vomiting.  Genitourinary: Negative for dysuria and flank pain.  Musculoskeletal: Negative for neck pain.  Skin: Negative for rash.  Neurological: Negative for headaches.  Hematological: Does not bruise/bleed easily.  Psychiatric/Behavioral: Negative for confusion.  Physical Exam Updated Vital Signs BP 111/67 (BP Location: Left Arm)   Pulse 75   Temp 98.3 F (36.8 C) (Oral)   Resp 20   Ht 1.575 m (5\' 2" )   Wt 83.9 kg (185 lb)   LMP 05/10/2016 (Exact Date)   SpO2 100%   Breastfeeding? No   BMI 33.84 kg/m   Physical Exam  Constitutional: She appears well-developed and well-nourished. No distress.  HENT:  Mouth/Throat: Oropharynx is clear and moist.  Eyes: Conjunctivae are normal. No scleral icterus.  Neck: Neck supple. No tracheal deviation present.  Cardiovascular: Normal rate, regular rhythm, normal heart sounds and intact distal pulses. Exam reveals no gallop and no friction rub.  No murmur heard. Pulmonary/Chest: Effort normal and breath sounds normal. No respiratory distress.  Abdominal: Soft. Normal appearance and  bowel sounds are normal. She exhibits no distension and no mass. There is no tenderness. There is no rebound and no guarding. No hernia.  Genitourinary:  Genitourinary Comments: No cva tenderness  Musculoskeletal: She exhibits no edema.  Neurological: She is alert.  Skin: Skin is warm and dry. No rash noted. She is not diaphoretic.  Psychiatric: She has a normal mood and affect.  Nursing note and vitals reviewed.    ED Treatments / Results  Labs (all labs ordered are listed, but only abnormal results are displayed) Results for orders placed or performed during the hospital encounter of 04/13/17  Lipase, blood  Result Value Ref Range   Lipase 26 11 - 51 U/L  Comprehensive metabolic panel  Result Value Ref Range   Sodium 141 135 - 145 mmol/L   Potassium 4.4 3.5 - 5.1 mmol/L   Chloride 108 101 - 111 mmol/L   CO2 24 22 - 32 mmol/L   Glucose, Bld 106 (H) 65 - 99 mg/dL   BUN 11 6 - 20 mg/dL   Creatinine, Ser 0.98 0.44 - 1.00 mg/dL   Calcium 9.2 8.9 - 11.9 mg/dL   Total Protein 7.1 6.5 - 8.1 g/dL   Albumin 4.1 3.5 - 5.0 g/dL   AST 25 15 - 41 U/L   ALT 26 14 - 54 U/L   Alkaline Phosphatase 113 38 - 126 U/L   Total Bilirubin 0.5 0.3 - 1.2 mg/dL   GFR calc non Af Amer >60 >60 mL/min   GFR calc Af Amer >60 >60 mL/min   Anion gap 9 5 - 15  CBC  Result Value Ref Range   WBC 13.0 (H) 4.0 - 10.5 K/uL   RBC 4.97 3.87 - 5.11 MIL/uL   Hemoglobin 12.8 12.0 - 15.0 g/dL   HCT 14.7 82.9 - 56.2 %   MCV 79.7 78.0 - 100.0 fL   MCH 25.8 (L) 26.0 - 34.0 pg   MCHC 32.3 30.0 - 36.0 g/dL   RDW 13.0 86.5 - 78.4 %   Platelets 376 150 - 400 K/uL  I-Stat beta hCG blood, ED  Result Value Ref Range   I-stat hCG, quantitative <5.0 <5 mIU/mL   Comment 3           US Abdomen Limited Ruq  Result Date: 04/13/2017 CLINICAL DATA:  Epigastric pain today EXAM: ULTRASOUND ABDOMEN LIMITED RIGHT UPPER QUADRANT COMPARISON:  05/17/2016 FINDINGS: Gallbladder: Layering gallstones are identified without  pericholecystic fluid or wall thickening. The largest calculus measures 5 mm. These are new since previous study. No sonographic Murphy sign noted by sonographer. Common bile duct: Diameter: Normal at 3.9 mm Liver: No focal lesion identified. Within normal limits  in parenchymal echogenicity. Portal vein is patent on color Doppler imaging with normal direction of blood flow towards the liver. IMPRESSION: Uncomplicated cholelithiasis. Electronically Signed   By: Tollie Eth M.D.   On: 04/13/2017 20:41    EKG None  Radiology US Abdomen Limited Ruq  Result Date: 04/13/2017 CLINICAL DATA:  Epigastric pain today EXAM: ULTRASOUND ABDOMEN LIMITED RIGHT UPPER QUADRANT COMPARISON:  05/17/2016 FINDINGS: Gallbladder: Layering gallstones are identified without pericholecystic fluid or wall thickening. The largest calculus measures 5 mm. These are new since previous study. No sonographic Murphy sign noted by sonographer. Common bile duct: Diameter: Normal at 3.9 mm Liver: No focal lesion identified. Within normal limits in parenchymal echogenicity. Portal vein is patent on color Doppler imaging with normal direction of blood flow towards the liver. IMPRESSION: Uncomplicated cholelithiasis. Electronically Signed   By: Tollie Eth M.D.   On: 04/13/2017 20:41    Procedures Procedures (including critical care time)  Medications Ordered in ED Medications  ondansetron (ZOFRAN-ODT) disintegrating tablet 4 mg (4 mg Oral Given 04/13/17 1713)  gi cocktail (Maalox,Lidocaine,Donnatal) (30 mLs Oral Given 04/13/17 1813)     Initial Impression / Assessment and Plan / ED Course  I have reviewed the triage vital signs and the nursing notes.  Pertinent labs & imaging results that were available during my care of the patient were reviewed by me and considered in my medical decision making (see chart for details).  Labs sent.  Labs reviewed - normal chemistries.   U/s reviewed - gallstones.   Reviewed nursing notes and  prior charts for additional history.   Pain has resolved. abd soft nt.  Discussed u/s w pt.    Final Clinical Impressions(s) / ED Diagnoses   Final diagnoses:  Epigastric abdominal pain    ED Discharge Orders    None       Cathren Laine, MD 04/13/17 2212

## 2017-04-13 NOTE — ED Provider Notes (Signed)
Patient placed in Quick Look pathway, seen and evaluated   Chief Complaint: Epigastric pain    HPI:   27 year old female presents with epigastric pain.  She has had intermittent epigastric pain for several weeks and usually it will last 45 minutes and resolve on its own.  Today she had acute onset of severe epigastric pain which is been constant since 12:00 today.  She has been told that it was her gallbladder in the past.  She has never had an endoscopy.  She reports associated chest pain, shortness of breath, nausea and vomiting.  She denies fever, cough, diarrhea, vaginal discharge or bleeding, urinary symptoms.  She is currently bottlefeeding her 687-week-old daughter. No prior abdominal surgeries  ROS: +abdominal pain, chest pain, nausea/vomiting  -fever, cough, diarrhea, urinary symptoms, vaginal bleeding or discharge  Physical Exam:   Gen: Mild distress due to pain  Neuro: Awake and Alert  Skin: Warm    Focused Exam: Heart: Regular rate and rhythm    Lungs: CTA    Abdomen: Tender in epigastric area   Zofran and GI cocktail ordered. RUQ US ordered. Initiation of care has begun. The patient has been counseled on the process, plan, and necessity for staying for the completion/evaluation, and the remainder of the medical screening examination    Bethel BornGekas, Natania Finigan Marie, PA-C 04/13/17 1745    Benjiman CorePickering, Nathan, MD 04/13/17 2211

## 2017-04-13 NOTE — ED Triage Notes (Signed)
Pt endorses abd pain for several weeks, today pain is worse and pt had episode of vomiting. VSS.

## 2017-04-13 NOTE — Discharge Instructions (Signed)
It was our pleasure to provide your ER care today - we hope that you feel better.  Your ultrasound shows gallstones.  Take motrin or aleve as need for pain. You may also take hydrocodone as need for pain. No driving when taking hydrocodone. Also, do not take tylenol or acetaminophen containing medication when taking hydrocodone.  You may take zofran as need for pain.   Follow up with general surgeon in 1 week - call office Monday AM for appointment.  Return to ER if worse, new symptoms, fevers, worsening or severe abdominal pain, persistent vomiting, other concern.

## 2017-04-13 NOTE — ED Notes (Signed)
Pt departed in NAD, refused use of wheelchair.  

## 2017-05-11 ENCOUNTER — Encounter (HOSPITAL_COMMUNITY): Payer: Self-pay

## 2017-05-11 NOTE — Patient Instructions (Addendum)
Your procedure is scheduled on: Friday, May 25, 2017   Surgery Time:  7:30AM-9:00AM   Report to [facility] Main  Entrance    Report to admitting at 5:30 AM   Call this number if you have problems the morning of surgery [phone]   Do not eat food or drink liquids :After Midnight.   Do NOT smoke after Midnight   Complete one Ensure drink the morning of surgery by  4:30AM the day of surgery.     Take these medicines the morning of surgery with A SIP OF WATER: None   Bring Asthma inhaler day of surgery                               You may not have any metal on your body including hair pins, jewelry, and body piercings             Do not wear make-up, lotions, powders, perfumes/cologne, or deodorant             Do not wear nail polish.  Do not shave  48 hours prior to surgery.              Men may shave face and neck.   Do not bring valuables to the hospital. [facility] IS NOT             RESPONSIBLE   FOR VALUABLES.   Contacts, dentures or bridgework may not be worn into surgery.    Patients discharged the day of surgery will not be allowed to drive home.   Name and phone number of your driver:   Special Instructions: Bring a copy of your healthcare power of attorney and living will documents         the day of surgery if you haven't scanned them in before.              Please read over the following fact sheets you were given:  [facility] - Preparing for Surgery Before surgery, you can play an important role.  Because skin is not sterile, your skin needs to be as free of germs as possible.  You can reduce the number of germs on your skin by washing with CHG (chlorahexidine gluconate) soap before surgery.  CHG is an antiseptic cleaner which kills germs and bonds with the skin to continue killing germs even after washing. Please DO NOT use if you have an allergy to CHG or antibacterial soaps.  If your skin becomes reddened/irritated stop using the CHG and  inform your nurse when you arrive at Short Stay. Do not shave (including legs and underarms) for at least 48 hours prior to the first CHG shower.  You may shave your face/neck.  Please follow these instructions carefully:  1.  Shower with CHG Soap the night before surgery and the  morning of surgery.  2.  If you choose to wash your hair, wash your hair first as usual with your normal  shampoo.  3.  After you shampoo, rinse your hair and body thoroughly to remove the shampoo.                             4.  Use CHG as you would any other liquid soap.  You can apply chg directly to the skin and wash.  Gently with a scrungie or clean washcloth.  5.  Apply the CHG Soap to your body ONLY FROM THE NECK DOWN.   Do   not use on face/ open                           Wound or open sores. Avoid contact with eyes, ears mouth and   genitals (private parts).                       Wash face,  Genitals (private parts) with your normal soap.             6.  Wash thoroughly, paying special attention to the area where your    surgery  will be performed.  7.  Thoroughly rinse your body with warm water from the neck down.  8.  DO NOT shower/wash with your normal soap after using and rinsing off the CHG Soap.                9.  Pat yourself dry with a clean towel.            10.  Wear clean pajamas.            11.  Place clean sheets on your bed the night of your first shower and do not  sleep with pets. Day of Surgery : Do not apply any lotions/deodorants the morning of surgery.  Please wear clean clothes to the hospital/surgery center.  FAILURE TO FOLLOW THESE INSTRUCTIONS MAY RESULT IN THE CANCELLATION OF YOUR SURGERY  PATIENT SIGNATURE_________________________________  NURSE SIGNATURE__________________________________  ________________________________________________________________________

## 2017-05-16 ENCOUNTER — Encounter (HOSPITAL_COMMUNITY): Payer: Self-pay

## 2017-05-16 ENCOUNTER — Other Ambulatory Visit: Payer: Self-pay

## 2017-05-16 ENCOUNTER — Encounter (HOSPITAL_COMMUNITY)
Admission: RE | Admit: 2017-05-16 | Discharge: 2017-05-16 | Disposition: A | Discharge status: Home / Self Care | Payer: Medicaid Other | Source: Ambulatory Visit | Attending: Surgery | Admitting: Surgery

## 2017-05-16 DIAGNOSIS — Z01812 Encounter for preprocedural laboratory examination: Secondary | ICD-10-CM | POA: Insufficient documentation

## 2017-05-16 DIAGNOSIS — K8080 Other cholelithiasis without obstruction: Secondary | ICD-10-CM | POA: Insufficient documentation

## 2017-05-16 HISTORY — DX: Attention-deficit hyperactivity disorder, unspecified type: F90.9

## 2017-05-16 HISTORY — DX: Gastro-esophageal reflux disease without esophagitis: K21.9

## 2017-05-16 HISTORY — DX: Anxiety disorder, unspecified: F41.9

## 2017-05-16 HISTORY — DX: Major depressive disorder, single episode, unspecified: F32.9

## 2017-05-16 HISTORY — DX: Calculus of gallbladder without cholecystitis without obstruction: K80.20

## 2017-05-16 HISTORY — DX: Depression, unspecified: F32.A

## 2017-05-16 LAB — CBC
HCT: 40.8 % (ref 36.0–46.0)
HEMOGLOBIN: 13.1 g/dL (ref 12.0–15.0)
MCH: 26.1 pg (ref 26.0–34.0)
MCHC: 32.1 g/dL (ref 30.0–36.0)
MCV: 81.4 fL (ref 78.0–100.0)
Platelets: 426 10*3/uL — ABNORMAL HIGH (ref 150–400)
RBC: 5.01 MIL/uL (ref 3.87–5.11)
RDW: 14.5 % (ref 11.5–15.5)
WBC: 10.5 10*3/uL (ref 4.0–10.5)

## 2017-05-16 NOTE — Pre-Procedure Instructions (Signed)
CBC results 05/16/2017 faxed Dr. Daphine Deutscher via epic.

## 2017-05-24 NOTE — Pre-Procedure Instructions (Signed)
Ms. Fellner is aware to arrive 05/25/2017 at 7:30AM.

## 2017-05-25 ENCOUNTER — Encounter (HOSPITAL_COMMUNITY)
Admission: RE | Disposition: A | Discharge status: Home / Self Care | Payer: Self-pay | Source: Ambulatory Visit | Attending: Surgery

## 2017-05-25 ENCOUNTER — Ambulatory Visit (HOSPITAL_COMMUNITY): Payer: Medicaid Other | Admitting: Anesthesiology

## 2017-05-25 ENCOUNTER — Encounter (HOSPITAL_COMMUNITY): Payer: Self-pay | Admitting: *Deleted

## 2017-05-25 ENCOUNTER — Ambulatory Visit (HOSPITAL_COMMUNITY)
Admission: RE | Admit: 2017-05-25 | Discharge: 2017-05-25 | Disposition: A | Discharge status: Home / Self Care | Payer: Medicaid Other | Source: Ambulatory Visit | Attending: Surgery | Admitting: Surgery

## 2017-05-25 ENCOUNTER — Ambulatory Visit (HOSPITAL_COMMUNITY): Payer: Medicaid Other

## 2017-05-25 DIAGNOSIS — K8020 Calculus of gallbladder without cholecystitis without obstruction: Secondary | ICD-10-CM

## 2017-05-25 DIAGNOSIS — F329 Major depressive disorder, single episode, unspecified: Secondary | ICD-10-CM | POA: Insufficient documentation

## 2017-05-25 DIAGNOSIS — K219 Gastro-esophageal reflux disease without esophagitis: Secondary | ICD-10-CM | POA: Insufficient documentation

## 2017-05-25 DIAGNOSIS — Z79899 Other long term (current) drug therapy: Secondary | ICD-10-CM | POA: Insufficient documentation

## 2017-05-25 DIAGNOSIS — K8010 Calculus of gallbladder with chronic cholecystitis without obstruction: Secondary | ICD-10-CM | POA: Insufficient documentation

## 2017-05-25 DIAGNOSIS — J45909 Unspecified asthma, uncomplicated: Secondary | ICD-10-CM | POA: Diagnosis not present

## 2017-05-25 DIAGNOSIS — Z91040 Latex allergy status: Secondary | ICD-10-CM | POA: Insufficient documentation

## 2017-05-25 DIAGNOSIS — Z9049 Acquired absence of other specified parts of digestive tract: Secondary | ICD-10-CM

## 2017-05-25 DIAGNOSIS — F419 Anxiety disorder, unspecified: Secondary | ICD-10-CM | POA: Diagnosis not present

## 2017-05-25 DIAGNOSIS — Z87891 Personal history of nicotine dependence: Secondary | ICD-10-CM | POA: Diagnosis not present

## 2017-05-25 DIAGNOSIS — K811 Chronic cholecystitis: Secondary | ICD-10-CM | POA: Diagnosis present

## 2017-05-25 HISTORY — PX: CHOLECYSTECTOMY: SHX55

## 2017-05-25 LAB — PREGNANCY, URINE: PREG TEST UR: NEGATIVE

## 2017-05-25 SURGERY — LAPAROSCOPIC CHOLECYSTECTOMY WITH INTRAOPERATIVE CHOLANGIOGRAM
Anesthesia: General | Site: Abdomen

## 2017-05-25 MED ORDER — HYDROMORPHONE HCL 1 MG/ML IJ SOLN: 0.2500 mg | INTRAMUSCULAR | Status: DC | PRN

## 2017-05-25 MED ORDER — HYDROCODONE-ACETAMINOPHEN 5-325 MG PO TABS: 1.0000 | ORAL_TABLET | ORAL | 0 refills | Status: DC | PRN

## 2017-05-25 MED ORDER — SODIUM CHLORIDE 0.9 % IV SOLN
INTRAVENOUS | Status: AC
  Filled 2017-05-25: qty 2

## 2017-05-25 MED ORDER — LACTATED RINGERS IV SOLN: INTRAVENOUS | Status: DC

## 2017-05-25 MED ORDER — ROCURONIUM BROMIDE 10 MG/ML (PF) SYRINGE
PREFILLED_SYRINGE | INTRAVENOUS | Status: DC | PRN
  Administered 2017-05-25: 40 mg via INTRAVENOUS

## 2017-05-25 MED ORDER — BUPIVACAINE LIPOSOME 1.3 % IJ SUSP
20.0000 mL | Freq: Once | INTRAMUSCULAR | Status: AC
  Administered 2017-05-25: 20 mL
  Filled 2017-05-25: qty 20

## 2017-05-25 MED ORDER — CHLORHEXIDINE GLUCONATE CLOTH 2 % EX PADS: 6.0000 | MEDICATED_PAD | Freq: Once | CUTANEOUS | Status: DC

## 2017-05-25 MED ORDER — IOPAMIDOL (ISOVUE-300) INJECTION 61%
INTRAVENOUS | Status: AC
  Filled 2017-05-25: qty 50

## 2017-05-25 MED ORDER — RINGERS IRRIGATION IR SOLN
Status: DC | PRN
  Administered 2017-05-25: 1000 mL

## 2017-05-25 MED ORDER — LACTATED RINGERS IV SOLN
INTRAVENOUS | Status: DC
  Administered 2017-05-25: 1000 mL via INTRAVENOUS
  Administered 2017-05-25: 11:00:00 via INTRAVENOUS

## 2017-05-25 MED ORDER — CEFAZOLIN SODIUM-DEXTROSE 2-4 GM/100ML-% IV SOLN
2.0000 g | INTRAVENOUS | Status: DC
Start: 2017-05-25 — End: 2017-05-25

## 2017-05-25 MED ORDER — ONDANSETRON HCL 4 MG/2ML IJ SOLN
INTRAMUSCULAR | Status: DC | PRN
  Administered 2017-05-25: 4 mg via INTRAVENOUS

## 2017-05-25 MED ORDER — ROCURONIUM BROMIDE 10 MG/ML (PF) SYRINGE
PREFILLED_SYRINGE | INTRAVENOUS | Status: AC
  Filled 2017-05-25: qty 5

## 2017-05-25 MED ORDER — HEPARIN SODIUM (PORCINE) 5000 UNIT/ML IJ SOLN
5000.0000 [IU] | Freq: Once | INTRAMUSCULAR | Status: AC
  Administered 2017-05-25: 5000 [IU] via SUBCUTANEOUS
  Filled 2017-05-25: qty 1

## 2017-05-25 MED ORDER — PROMETHAZINE HCL 25 MG/ML IJ SOLN: 6.2500 mg | INTRAMUSCULAR | Status: DC | PRN

## 2017-05-25 MED ORDER — LIDOCAINE 2% (20 MG/ML) 5 ML SYRINGE
INTRAMUSCULAR | Status: AC
  Filled 2017-05-25: qty 5

## 2017-05-25 MED ORDER — FENTANYL CITRATE (PF) 100 MCG/2ML IJ SOLN
INTRAMUSCULAR | Status: AC
  Filled 2017-05-25: qty 2

## 2017-05-25 MED ORDER — PROPOFOL 10 MG/ML IV BOLUS
INTRAVENOUS | Status: DC | PRN
  Administered 2017-05-25: 100 mg via INTRAVENOUS

## 2017-05-25 MED ORDER — CELECOXIB 200 MG PO CAPS: 200.0000 mg | ORAL_CAPSULE | ORAL | Status: DC

## 2017-05-25 MED ORDER — SUCCINYLCHOLINE CHLORIDE 200 MG/10ML IV SOSY
PREFILLED_SYRINGE | INTRAVENOUS | Status: DC | PRN
  Administered 2017-05-25: 100 mg via INTRAVENOUS

## 2017-05-25 MED ORDER — ACETAMINOPHEN 500 MG PO TABS: 1000.0000 mg | ORAL_TABLET | ORAL | Status: DC

## 2017-05-25 MED ORDER — MIDAZOLAM HCL 2 MG/2ML IJ SOLN
INTRAMUSCULAR | Status: AC
  Filled 2017-05-25: qty 2

## 2017-05-25 MED ORDER — LIDOCAINE 2% (20 MG/ML) 5 ML SYRINGE
INTRAMUSCULAR | Status: DC | PRN
  Administered 2017-05-25: 100 mg via INTRAVENOUS

## 2017-05-25 MED ORDER — ACETAMINOPHEN 500 MG PO TABS
1000.0000 mg | ORAL_TABLET | ORAL | Status: AC
  Administered 2017-05-25: 1000 mg via ORAL
  Filled 2017-05-25: qty 2

## 2017-05-25 MED ORDER — SUCCINYLCHOLINE CHLORIDE 200 MG/10ML IV SOSY
PREFILLED_SYRINGE | INTRAVENOUS | Status: AC
  Filled 2017-05-25: qty 10

## 2017-05-25 MED ORDER — MIDAZOLAM HCL 5 MG/5ML IJ SOLN
INTRAMUSCULAR | Status: DC | PRN
  Administered 2017-05-25: 2 mg via INTRAVENOUS

## 2017-05-25 MED ORDER — FENTANYL CITRATE (PF) 100 MCG/2ML IJ SOLN
INTRAMUSCULAR | Status: DC | PRN
  Administered 2017-05-25: 25 ug via INTRAVENOUS
  Administered 2017-05-25: 50 ug via INTRAVENOUS
  Administered 2017-05-25: 25 ug via INTRAVENOUS

## 2017-05-25 MED ORDER — GABAPENTIN 300 MG PO CAPS: 300.0000 mg | ORAL_CAPSULE | ORAL | Status: DC

## 2017-05-25 MED ORDER — CEFOTETAN DISODIUM-DEXTROSE 2-2.08 GM-%(50ML) IV SOLR
2.0000 g | INTRAVENOUS | Status: AC
  Administered 2017-05-25: 2 g via INTRAVENOUS

## 2017-05-25 MED ORDER — HEPARIN SODIUM (PORCINE) 5000 UNIT/ML IJ SOLN: 5000.0000 [IU] | Freq: Once | INTRAMUSCULAR | Status: DC

## 2017-05-25 MED ORDER — IOPAMIDOL (ISOVUE-300) INJECTION 61%
INTRAVENOUS | Status: DC | PRN
  Administered 2017-05-25: 5 mL

## 2017-05-25 MED ORDER — MEPERIDINE HCL 50 MG/ML IJ SOLN: 6.2500 mg | INTRAMUSCULAR | Status: DC | PRN

## 2017-05-25 MED ORDER — PROPOFOL 10 MG/ML IV BOLUS
INTRAVENOUS | Status: AC
  Filled 2017-05-25: qty 20

## 2017-05-25 MED ORDER — GABAPENTIN 300 MG PO CAPS
300.0000 mg | ORAL_CAPSULE | ORAL | Status: AC
  Administered 2017-05-25: 300 mg via ORAL
  Filled 2017-05-25: qty 1

## 2017-05-25 MED ORDER — SUGAMMADEX SODIUM 200 MG/2ML IV SOLN
INTRAVENOUS | Status: DC | PRN
  Administered 2017-05-25: 200 mg via INTRAVENOUS

## 2017-05-25 MED ORDER — DEXAMETHASONE SODIUM PHOSPHATE 10 MG/ML IJ SOLN
INTRAMUSCULAR | Status: DC | PRN
  Administered 2017-05-25: 10 mg via INTRAVENOUS

## 2017-05-25 MED ORDER — CELECOXIB 200 MG PO CAPS
200.0000 mg | ORAL_CAPSULE | ORAL | Status: AC
  Administered 2017-05-25: 200 mg via ORAL
  Filled 2017-05-25: qty 1

## 2017-05-25 SURGICAL SUPPLY — 36 items
APPLICATOR COTTON TIP 6IN STRL (MISCELLANEOUS) IMPLANT
APPLIER CLIP ROT 10 11.4 M/L (STAPLE) ×3
BENZOIN TINCTURE PRP APPL 2/3 (GAUZE/BANDAGES/DRESSINGS) IMPLANT
CABLE HIGH FREQUENCY MONO STRZ (ELECTRODE) ×3 IMPLANT
CATH REDDICK CHOLANGI 4FR 50CM (CATHETERS) ×3 IMPLANT
CLIP APPLIE ROT 10 11.4 M/L (STAPLE) ×1 IMPLANT
CLOSURE WOUND 1/2 X4 (GAUZE/BANDAGES/DRESSINGS)
COVER MAYO STAND STRL (DRAPES) ×3 IMPLANT
COVER SURGICAL LIGHT HANDLE (MISCELLANEOUS) ×3 IMPLANT
DECANTER SPIKE VIAL GLASS SM (MISCELLANEOUS) ×3 IMPLANT
DERMABOND ADVANCED (GAUZE/BANDAGES/DRESSINGS) ×2
DERMABOND ADVANCED .7 DNX12 (GAUZE/BANDAGES/DRESSINGS) ×1 IMPLANT
DRAPE C-ARM 42X120 X-RAY (DRAPES) ×3 IMPLANT
ELECT PENCIL ROCKER SW 15FT (MISCELLANEOUS) IMPLANT
ELECT REM PT RETURN 15FT ADLT (MISCELLANEOUS) ×3 IMPLANT
GLOVE BIOGEL M 8.0 STRL (GLOVE) ×3 IMPLANT
GOWN STRL REUS W/TWL XL LVL3 (GOWN DISPOSABLE) ×9 IMPLANT
HEMOSTAT SURGICEL 4X8 (HEMOSTASIS) IMPLANT
IV CATH 14GX2 1/4 (CATHETERS) ×3 IMPLANT
KIT BASIN OR (CUSTOM PROCEDURE TRAY) ×3 IMPLANT
L-HOOK LAP DISP 36CM (ELECTROSURGICAL)
LHOOK LAP DISP 36CM (ELECTROSURGICAL) IMPLANT
POUCH RETRIEVAL ECOSAC 10 (ENDOMECHANICALS) IMPLANT
POUCH RETRIEVAL ECOSAC 10MM (ENDOMECHANICALS)
SCISSORS LAP 5X45 EPIX DISP (ENDOMECHANICALS) ×3 IMPLANT
SET IRRIG TUBING LAPAROSCOPIC (IRRIGATION / IRRIGATOR) ×3 IMPLANT
SLEEVE XCEL OPT CAN 5 100 (ENDOMECHANICALS) ×3 IMPLANT
STRIP CLOSURE SKIN 1/2X4 (GAUZE/BANDAGES/DRESSINGS) IMPLANT
SUT MNCRL AB 4-0 PS2 18 (SUTURE) ×3 IMPLANT
SYR 20CC LL (SYRINGE) ×3 IMPLANT
TOWEL OR 17X26 10 PK STRL BLUE (TOWEL DISPOSABLE) ×3 IMPLANT
TRAY LAPAROSCOPIC (CUSTOM PROCEDURE TRAY) ×3 IMPLANT
TROCAR BLADELESS OPT 5 100 (ENDOMECHANICALS) ×3 IMPLANT
TROCAR XCEL BLUNT TIP 100MML (ENDOMECHANICALS) IMPLANT
TROCAR XCEL NON-BLD 11X100MML (ENDOMECHANICALS) ×3 IMPLANT
TUBING INSUF HEATED (TUBING) ×3 IMPLANT

## 2017-05-25 NOTE — Anesthesia Procedure Notes (Signed)
Procedure Name: Intubation Date/Time: 05/25/2017 9:57 AM Performed by: Lind Covert, CRNA Pre-anesthesia Checklist: Patient identified, Emergency Drugs available, Suction available, Patient being monitored and Timeout performed Patient Re-evaluated:Patient Re-evaluated prior to induction Oxygen Delivery Method: Circle system utilized Preoxygenation: Pre-oxygenation with 100% oxygen Induction Type: IV induction Ventilation: Mask ventilation without difficulty Laryngoscope Size: Mac and 4 Grade View: Grade I Tube type: Oral Tube size: 7.0 mm Number of attempts: 1 Airway Equipment and Method: Stylet Placement Confirmation: ETT inserted through vocal cords under direct vision,  positive ETCO2 and breath sounds checked- equal and bilateral Secured at: 21 cm Tube secured with: Tape Dental Injury: Teeth and Oropharynx as per pre-operative assessment

## 2017-05-25 NOTE — Progress Notes (Signed)
Notified patient her prescription was left at the hospital. Patient is going to send her mother by [facility] to pick up the prescription. Will put the prescripton in an envelope with patients mothers name "Peyton Bottoms" on it. The envelope will be at the volunteers desk at the front of the hospital.

## 2017-05-25 NOTE — Transfer of Care (Signed)
Immediate Anesthesia Transfer of Care Note  Patient: Anna Decker  Procedure(s) Performed: LAPAROSCOPIC CHOLECYSTECTOMY WITH INTRAOPERATIVE CHOLANGIOGRAM (N/A Abdomen)  Patient Location: PACU  Anesthesia Type:General  Level of Consciousness: sedated  Airway & Oxygen Therapy: Patient Spontanous Breathing and Patient connected to face mask oxygen  Post-op Assessment: Report given to RN and Post -op Vital signs reviewed and stable  Post vital signs: Reviewed and stable  Last Vitals:  Vitals Value Taken Time  BP 110/60 05/25/2017 11:21 AM  Temp    Pulse 72 05/25/2017 11:24 AM  Resp 16 05/25/2017 11:24 AM  SpO2 96 % 05/25/2017 11:24 AM  Vitals shown include unvalidated device data.  Last Pain:  Vitals:   05/25/17 0757  TempSrc:   PainSc: 0-No pain         Complications: No apparent anesthesia complications

## 2017-05-25 NOTE — Anesthesia Preprocedure Evaluation (Addendum)
Anesthesia Evaluation  Patient identified by MRN, date of birth, ID band Patient awake    Reviewed: Allergy & Precautions, NPO status , Patient's Chart, lab work & pertinent test results  Airway Mallampati: II  TM Distance: >3 FB Neck ROM: Full    Dental  (+) Teeth Intact, Dental Advisory Given   Pulmonary asthma , Current Smoker,    breath sounds clear to auscultation       Cardiovascular hypertension,  Rhythm:Regular Rate:Normal     Neuro/Psych PSYCHIATRIC DISORDERS Anxiety Depression    GI/Hepatic Neg liver ROS, GERD  ,  Endo/Other  negative endocrine ROS  Renal/GU negative Renal ROS     Musculoskeletal negative musculoskeletal ROS (+)   Abdominal   Peds  Hematology negative hematology ROS (+)   Anesthesia Other Findings   Reproductive/Obstetrics negative OB ROS                            Anesthesia Physical Anesthesia Plan  ASA: II  Anesthesia Plan: General   Post-op Pain Management:    Induction: Intravenous  PONV Risk Score and Plan: 3 and Ondansetron, Dexamethasone and Midazolam  Airway Management Planned: Oral ETT  Additional Equipment: None  Intra-op Plan:   Post-operative Plan: Extubation in OR  Informed Consent: I have reviewed the patients History and Physical, chart, labs and discussed the procedure including the risks, benefits and alternatives for the proposed anesthesia with the patient or authorized representative who has indicated his/her understanding and acceptance.   Dental advisory given  Plan Discussed with: CRNA  Anesthesia Plan Comments:        Anesthesia Quick Evaluation

## 2017-05-25 NOTE — H&P (Signed)
Anna Decker Location: [place] Office Patient #: [id number] DOB: 1990-03-18 Divorced / Language: [place] / Race: White Female  History of Present Illness  The patient is a 27 year old female who presents for evaluation of gall stones. The patient is a 27 year old white female who is postpartum birth of her daughter in February who was seen in the emergency room at [facility] with intermittent upper abdominal pain that she described as lasting several hours and is quite severe. Pain can be largest fair and can sometimes be dull and radiating to the back. Her gallbladder ultrasound showed layering stones without pericholecystic fluid. She has a positive family history of gallstones in the gallbladder surgery and her mother. Past medical history was remarkable for history of asthma and she had hypertensive tension with pregnancy. She's had prior D&C and tonsillectomy but no abdominal surgery. She is a former smoker.  With the aid of the cranes gallbladder book I described laparoscopic cholecystectomy with her and her mother in detail. Indicated the major risk is common bile duct injury but they're certainly other drips including bowel injury peritonitis bleeding pancreatitis. She is aware of these. Because of the intensity of these she would like to proceed with cholecystectomy.   Past Surgical History Anna Decker, CMA; 04/18/2017 2:27 PM) Tonsillectomy   Diagnostic Studies History Anna Decker, CMA; 04/18/2017 2:27 PM) Colonoscopy  never Mammogram  never Pap Smear  1-5 years ago  Allergies Anna Decker, CMA; 04/18/2017 2:28 PM) Latex   Medication History Anna Decker, CMA; 04/18/2017 2:28 PM) Hydrocodone-Acetaminophen (5-325MG  Tablet, Oral) Active. Ondansetron (  Tablet Disint, Oral) Active. Medications Reconciled  Social History Anna Decker, CMA; 04/18/2017 2:27 PM) Alcohol use  Occasional alcohol use. Caffeine use  Carbonated  beverages, Coffee, Tea. No drug use  Tobacco use  Former smoker.  Family History Anna Decker, CMA; 04/18/2017 2:27 PM) Cancer  Family Members In General. Colon Polyps  Mother. Diabetes Mellitus  Family Members In General, Father. Hypertension  Family Members In General, Father. Respiratory Condition  Family Members In General. Thyroid problems  Family Members In General.  Pregnancy / Birth History Anna Decker, CMA; 04/18/2017 2:27 PM) Age at menarche  14 years. Gravida  4 Irregular periods  Maternal age  28-20 Para  2  Other Problems Anna Decker, CMA; 04/18/2017 2:27 PM) Anxiety Disorder  Asthma  Back Pain  Cholelithiasis  Gastroesophageal Reflux Disease  Hemorrhoids     Review of Systems (Anna Decker CMA; 04/18/2017 2:27 PM) General Present- Fever. Not Present- Appetite Loss, Chills, Fatigue, Night Sweats, Weight Gain and Weight Loss. Skin Not Present- Change in Wart/Mole, Dryness, Hives, Jaundice, New Lesions, Non-Healing Wounds, Rash and Ulcer. HEENT Present- Seasonal Allergies. Not Present- Earache, Hearing Loss, Hoarseness, Nose Bleed, Oral Ulcers, Ringing in the Ears, Sinus Pain, Sore Throat, Visual Disturbances, Wears glasses/contact lenses and Yellow Eyes. Respiratory Present- Snoring and Wheezing. Not Present- Bloody sputum, Chronic Cough and Difficulty Breathing. Cardiovascular Not Present- Chest Pain, Difficulty Breathing Lying Down, Leg Cramps, Palpitations, Rapid Heart Rate, Shortness of Breath and Swelling of Extremities. Gastrointestinal Present- Abdominal Pain, Bloating, Chronic diarrhea, Excessive gas, Hemorrhoids, Nausea and Vomiting. Not Present- Bloody Stool, Change in Bowel Habits, Constipation, Difficulty Swallowing, Gets full quickly at meals, Indigestion and Rectal Pain. Female Genitourinary Not Present- Frequency, Nocturia, Painful Urination, Pelvic Pain and Urgency. Musculoskeletal Present- Back Pain. Not  Present- Joint Pain, Joint Stiffness, Muscle Pain, Muscle Weakness and Swelling of Extremities. Neurological Not Present- Decreased Memory, Fainting, Headaches, Numbness, Seizures, Tingling, Tremor, Trouble walking and Weakness.  Psychiatric Present- Anxiety. Not Present- Bipolar, Change in Sleep Pattern, Depression, Fearful and Frequent crying. Endocrine Not Present- Cold Intolerance, Excessive Hunger, Hair Changes, Heat Intolerance, Hot flashes and New Diabetes. Hematology Not Present- Blood Thinners, Easy Bruising, Excessive bleeding, Gland problems, HIV and Persistent Infections.  Vitals (Anna Decker CMA; 04/18/2017 2:27 PM) 04/18/2017 2:27 PM Weight: 190.8 lb Height: 63in Body Surface Area: 1.9 m Body Mass Index: 33.8 kg/m  Pulse: 81 (Regular)  BP: 130/62 (Sitting, Left Arm, Standard)       Physical Exam (Anna Decker B. 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     Assessment & Plan Molli Hazard B. Daphine Deutscher MD; 04/18/2017 2:48 PM) GALLSTONES (K80.20) Impression: history of acute cholecystitis with gallbladder ultrasound exam showing layering gallstones without evidence of acute cholecystitis. The largest calculus was 5 mm. Laparoscopic cholecystectomy with intraoperative cholangiogram  Matt B. Daphine Deutscher, MD, FACS

## 2017-05-25 NOTE — Op Note (Signed)
Malachy Mood  Primary Care Physician:  Patient, No Pcp Per    05/25/2017  11:19 AM  Procedure: Laparoscopic Cholecystectomy with intraoperative cholangiogram  Surgeon: Susy Frizzle B. Daphine Deutscher, MD, FACS Asst:  none  Anes:  General  Drains:  None  Findings: Chronic cholecystitis with normal IOC  Description of Procedure: The patient was taken to OR 1 and given general anesthesia.  The patient was prepped with PCMX and draped sterilely. A time out was performed.  Access to the abdomen was achieved with a 5 mm Optiview through the right upper quadrant.  Port placement included three 5 mm trocars and one 12 mm trocar.    The gallbladder was visualized and the fundus was grasped and the gallbladder was elevated. Traction on the infundibulum allowed for successful demonstration of the critical view. Inflammatory changes were chronic.  The cystic duct was identified and clipped up on the gallbladder and an incision was made in the cystic duct and the Reddick catheter was inserted after milking the cystic duct of any debris. A dynamic cholangiogram was performed which demonstrated good intrahepatic filling and free flow into the duodenum.    The cystic duct was then triple clipped and divided, the cystic artery was double clipped and divided and then the gallbladder was removed from the gallbladder bed. Removal of the gallbladder from the gallbladder bed was performed with the L hook with some bile spillage.  The gallbladder was then placed in a bag and brought out through one of the trocar sites. The gallbladder bed was inspected and no bleeding or bile leaks were seen.  Incisions were injected with Exparel and closed with 4-0 Monocryl and Dermabond on the skin.  Sponge and needle count were correct.    The patient was taken to the recovery room in satisfactory condition.

## 2017-05-25 NOTE — Discharge Instructions (Signed)

## 2017-05-25 NOTE — Interval H&P Note (Signed)
History and Physical Interval Note:  05/25/2017 9:45 AM  Anna Decker  has presented today for surgery, with the diagnosis of Chronic cholecystitis  The various methods of treatment have been discussed with the patient and family. After consideration of risks, benefits and other options for treatment, the patient has consented to  Procedure(s): LAPAROSCOPIC CHOLECYSTECTOMY WITH INTRAOPERATIVE CHOLANGIOGRAM (N/A) as a surgical intervention .  The patient's history has been reviewed, patient examined, no change in status, stable for surgery.  I have reviewed the patient's chart and labs.  Questions were answered to the patient's satisfaction.     Valarie Merino

## 2017-05-25 NOTE — Anesthesia Postprocedure Evaluation (Signed)
Anesthesia Post Note  Patient: Anna Decker  Procedure(s) Performed: LAPAROSCOPIC CHOLECYSTECTOMY WITH INTRAOPERATIVE CHOLANGIOGRAM (N/A Abdomen)     Patient location during evaluation: PACU Anesthesia Type: General Level of consciousness: awake and alert Pain management: pain level controlled Vital Signs Assessment: post-procedure vital signs reviewed and stable Respiratory status: spontaneous breathing, nonlabored ventilation, respiratory function stable and patient connected to nasal cannula oxygen Cardiovascular status: blood pressure returned to baseline and stable Postop Assessment: no apparent nausea or vomiting Anesthetic complications: no    Last Vitals:  Vitals:   05/25/17 1215 05/25/17 1230  BP: 120/73 (!) 144/76  Pulse: 77 87  Resp: 18 18  Temp: 37.2 C 37.1 C  SpO2: 94% 100%    Last Pain:  Vitals:   05/25/17 1230  TempSrc: Oral  PainSc:                  Shelton Silvas

## 2018-06-08 ENCOUNTER — Encounter (HOSPITAL_COMMUNITY): Payer: Self-pay

## 2018-06-08 ENCOUNTER — Emergency Department (HOSPITAL_COMMUNITY)
Admission: EM | Admit: 2018-06-08 | Discharge: 2018-06-08 | Disposition: A | Discharge status: Home / Self Care | Payer: Medicaid Other | Attending: Emergency Medicine | Admitting: Emergency Medicine

## 2018-06-08 ENCOUNTER — Other Ambulatory Visit: Payer: Self-pay

## 2018-06-08 ENCOUNTER — Emergency Department (HOSPITAL_COMMUNITY): Payer: Medicaid Other

## 2018-06-08 ENCOUNTER — Emergency Department (HOSPITAL_COMMUNITY)
Admission: EM | Admit: 2018-06-08 | Discharge: 2018-06-09 | Disposition: A | Discharge status: Other Acute Inpatient Hospital | Payer: Medicaid Other | Source: Home / Self Care | Attending: Emergency Medicine | Admitting: Emergency Medicine

## 2018-06-08 DIAGNOSIS — Z20828 Contact with and (suspected) exposure to other viral communicable diseases: Secondary | ICD-10-CM | POA: Insufficient documentation

## 2018-06-08 DIAGNOSIS — F17210 Nicotine dependence, cigarettes, uncomplicated: Secondary | ICD-10-CM | POA: Insufficient documentation

## 2018-06-08 DIAGNOSIS — R45851 Suicidal ideations: Secondary | ICD-10-CM | POA: Insufficient documentation

## 2018-06-08 DIAGNOSIS — M79671 Pain in right foot: Secondary | ICD-10-CM

## 2018-06-08 DIAGNOSIS — M795 Residual foreign body in soft tissue: Secondary | ICD-10-CM | POA: Diagnosis not present

## 2018-06-08 DIAGNOSIS — I10 Essential (primary) hypertension: Secondary | ICD-10-CM | POA: Insufficient documentation

## 2018-06-08 DIAGNOSIS — Z91040 Latex allergy status: Secondary | ICD-10-CM | POA: Diagnosis not present

## 2018-06-08 DIAGNOSIS — F332 Major depressive disorder, recurrent severe without psychotic features: Secondary | ICD-10-CM | POA: Insufficient documentation

## 2018-06-08 DIAGNOSIS — Z23 Encounter for immunization: Secondary | ICD-10-CM | POA: Insufficient documentation

## 2018-06-08 DIAGNOSIS — W25XXXA Contact with sharp glass, initial encounter: Secondary | ICD-10-CM | POA: Insufficient documentation

## 2018-06-08 DIAGNOSIS — J45909 Unspecified asthma, uncomplicated: Secondary | ICD-10-CM | POA: Insufficient documentation

## 2018-06-08 DIAGNOSIS — Z79899 Other long term (current) drug therapy: Secondary | ICD-10-CM | POA: Insufficient documentation

## 2018-06-08 DIAGNOSIS — F322 Major depressive disorder, single episode, severe without psychotic features: Secondary | ICD-10-CM | POA: Diagnosis present

## 2018-06-08 LAB — COMPREHENSIVE METABOLIC PANEL
ALT: 15 U/L (ref 0–44)
AST: 12 U/L — ABNORMAL LOW (ref 15–41)
Albumin: 4.1 g/dL (ref 3.5–5.0)
Alkaline Phosphatase: 81 U/L (ref 38–126)
Anion gap: 10 (ref 5–15)
BUN: 15 mg/dL (ref 6–20)
CO2: 21 mmol/L — ABNORMAL LOW (ref 22–32)
Calcium: 8.8 mg/dL — ABNORMAL LOW (ref 8.9–10.3)
Chloride: 109 mmol/L (ref 98–111)
Creatinine, Ser: 0.62 mg/dL (ref 0.44–1.00)
GFR calc Af Amer: >60 mL/min (ref >60)
GFR calc non Af Amer: >60 mL/min (ref >60)
Glucose, Bld: 95 mg/dL (ref 70–99)
Potassium: 3.7 mmol/L (ref 3.5–5.1)
Sodium: 140 mmol/L (ref 135–145)
Total Bilirubin: 0.3 mg/dL (ref 0.3–1.2)
Total Protein: 7.2 g/dL (ref 6.5–8.1)

## 2018-06-08 LAB — CBC
HCT: 46.3 % — ABNORMAL HIGH (ref 36.0–46.0)
Hemoglobin: 15.1 g/dL — ABNORMAL HIGH (ref 12.0–15.0)
MCH: 28.5 pg (ref 26.0–34.0)
MCHC: 32.6 g/dL (ref 30.0–36.0)
MCV: 87.5 fL (ref 80.0–100.0)
Platelets: 404 10*3/uL — ABNORMAL HIGH (ref 150–400)
RBC: 5.29 MIL/uL — ABNORMAL HIGH (ref 3.87–5.11)
RDW: 12.8 % (ref 11.5–15.5)
WBC: 16.4 10*3/uL — ABNORMAL HIGH (ref 4.0–10.5)
nRBC: 0 % (ref 0.0–0.2)

## 2018-06-08 LAB — RAPID URINE DRUG SCREEN, HOSP PERFORMED
Amphetamines: NOT DETECTED
Barbiturates: NOT DETECTED
Benzodiazepines: NOT DETECTED
Cocaine: NOT DETECTED
Opiates: NOT DETECTED
Tetrahydrocannabinol: NOT DETECTED

## 2018-06-08 LAB — I-STAT BETA HCG BLOOD, ED (MC, WL, AP ONLY): I-stat hCG, quantitative: <5 m[IU]/mL (ref <5)

## 2018-06-08 LAB — ACETAMINOPHEN LEVEL: Acetaminophen (Tylenol), Serum: <10 ug/mL — ABNORMAL LOW (ref 10–30)

## 2018-06-08 LAB — SALICYLATE LEVEL: Salicylate Lvl: <7 mg/dL (ref 2.8–30.0)

## 2018-06-08 LAB — ETHANOL: Alcohol, Ethyl (B): <10 mg/dL (ref <10)

## 2018-06-08 MED ORDER — CEPHALEXIN 500 MG PO CAPS: 500.0000 mg | ORAL_CAPSULE | Freq: Four times a day (QID) | ORAL | 0 refills | Status: DC

## 2018-06-08 MED ORDER — TETANUS-DIPHTH-ACELL PERTUSSIS 5-2.5-18.5 LF-MCG/0.5 IM SUSP
0.5000 mL | Freq: Once | INTRAMUSCULAR | Status: AC
  Administered 2018-06-08: 0.5 mL via INTRAMUSCULAR
  Filled 2018-06-08: qty 0.5

## 2018-06-08 MED ORDER — LIDOCAINE HCL (PF) 1 % IJ SOLN
5.0000 mL | Freq: Once | INTRAMUSCULAR | Status: AC
Start: 2018-06-08 — End: 2018-06-08
  Administered 2018-06-08: 5 mL
  Filled 2018-06-08: qty 30

## 2018-06-08 MED ORDER — CEPHALEXIN 500 MG PO CAPS
500.0000 mg | ORAL_CAPSULE | Freq: Four times a day (QID) | ORAL | Status: DC
  Administered 2018-06-08: 500 mg via ORAL
  Filled 2018-06-08: qty 1

## 2018-06-08 NOTE — ED Triage Notes (Signed)
Pt was seen earlier today for mental health. She states that she now feels suicidal and has never felt this way before. States that she feels trapped in her body. States that she is supposed to be medicated for anxiety and depression, but is not. Reports pain in her R foot from stepping in glass last night. Wound was assessed and wrapped during her previous visit.
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
## 2018-06-08 NOTE — Discharge Instructions (Addendum)
You were seen in the ED today for a piece of glass to your foot that was successfully removed; please take the antibiotics as prescribed for the next 5 days. Please follow up with your PCP regarding this as well as your recent medications/agitation. Attached is a resource guide to use should you need it prior to seeing your PCP. If you begin to have any feelings of harming yourself or anyone else you need to come back to the ED immediately. If you notice any redness, swelling, drainage of pus, or if you develop fever/chills to your foot you need to return to the ED immediately as well.

## 2018-06-08 NOTE — ED Notes (Signed)
Bed: [facility] Expected date:  Expected time:  Means of arrival:  Comments: 

## 2018-06-08 NOTE — ED Notes (Signed)
Bed: [facility]5 Expected date:  Expected time:  Means of arrival:  Comments: 

## 2018-06-08 NOTE — ED Triage Notes (Signed)
Pt states she feels depressed and is concerned she's bipolar. Pt states she is scared. Pt states she has been feeling like this for 6 months. Pt does see a therapist but was unable to get in touch with her this morning.  Pt denies SI/HI.  Pt states that she thinks she may need to be put on medicine. Pt is on anxiety and depression medication, but it makes her agitated. Pt states she is still having anxiety attacks. Pt feels trapped in her body and can't get out.  Pt states she broke a glass last night and has a small piece in right foot.

## 2018-06-08 NOTE — ED Notes (Signed)
Pt alert and oriented. Pt denies any pain or discomfort. Pt denies any SI, HI ,and avh at this time. Pt reports she just wants to get help and she feels depressed. Pt calm and cooperative. Pt safe will continue to monitor.

## 2018-06-08 NOTE — ED Notes (Signed)
Bed: [facility]29 Expected date:  Expected time:  Means of arrival:  Comments: Thier

## 2018-06-08 NOTE — ED Notes (Signed)
Bed: [facility]30 Expected date:  Expected time:  Means of arrival:  Comments: 
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
## 2018-06-08 NOTE — ED Notes (Signed)
Due to sign block not working in Triage and at desk, pt verbalized understanding instructions
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
## 2018-06-08 NOTE — ED Notes (Signed)
Dressing applied to rt lat foot and bottom, she is aware of wound care

## 2018-06-09 ENCOUNTER — Inpatient Hospital Stay (HOSPITAL_COMMUNITY)
Admission: AD | Admit: 2018-06-09 | Discharge: 2018-06-11 | DRG: 885 | Disposition: A | Discharge status: Home / Self Care | Payer: Medicaid Other | Source: Intra-hospital | Attending: Psychiatry | Admitting: Psychiatry

## 2018-06-09 ENCOUNTER — Encounter (HOSPITAL_COMMUNITY): Payer: Self-pay | Admitting: *Deleted

## 2018-06-09 ENCOUNTER — Other Ambulatory Visit: Payer: Self-pay

## 2018-06-09 DIAGNOSIS — J45909 Unspecified asthma, uncomplicated: Secondary | ICD-10-CM | POA: Diagnosis present

## 2018-06-09 DIAGNOSIS — F332 Major depressive disorder, recurrent severe without psychotic features: Secondary | ICD-10-CM

## 2018-06-09 DIAGNOSIS — F419 Anxiety disorder, unspecified: Secondary | ICD-10-CM | POA: Diagnosis present

## 2018-06-09 DIAGNOSIS — F322 Major depressive disorder, single episode, severe without psychotic features: Principal | ICD-10-CM | POA: Diagnosis present

## 2018-06-09 DIAGNOSIS — R45851 Suicidal ideations: Secondary | ICD-10-CM | POA: Diagnosis present

## 2018-06-09 DIAGNOSIS — G4700 Insomnia, unspecified: Secondary | ICD-10-CM | POA: Diagnosis present

## 2018-06-09 DIAGNOSIS — Z1159 Encounter for screening for other viral diseases: Secondary | ICD-10-CM | POA: Diagnosis not present

## 2018-06-09 DIAGNOSIS — F17210 Nicotine dependence, cigarettes, uncomplicated: Secondary | ICD-10-CM | POA: Diagnosis present

## 2018-06-09 LAB — URINALYSIS, ROUTINE W REFLEX MICROSCOPIC
Bilirubin Urine: NEGATIVE
Glucose, UA: NEGATIVE mg/dL
Ketones, ur: NEGATIVE mg/dL
Nitrite: NEGATIVE
Protein, ur: NEGATIVE mg/dL
Specific Gravity, Urine: 1.023 (ref 1.005–1.030)
pH: 6 (ref 5.0–8.0)

## 2018-06-09 LAB — SARS CORONAVIRUS 2 BY RT PCR (HOSPITAL ORDER, PERFORMED IN ~~LOC~~ HOSPITAL LAB): SARS Coronavirus 2: NEGATIVE

## 2018-06-09 MED ORDER — ALBUTEROL SULFATE HFA 108 (90 BASE) MCG/ACT IN AERS: 1.0000 | INHALATION_SPRAY | Freq: Four times a day (QID) | RESPIRATORY_TRACT | Status: DC | PRN

## 2018-06-09 MED ORDER — ACETAMINOPHEN 325 MG PO TABS: 650.0000 mg | ORAL_TABLET | Freq: Four times a day (QID) | ORAL | Status: DC | PRN

## 2018-06-09 MED ORDER — HYDROXYZINE HCL 25 MG PO TABS
25.0000 mg | ORAL_TABLET | Freq: Three times a day (TID) | ORAL | Status: DC | PRN
  Administered 2018-06-09 (×2): 25 mg via ORAL
  Filled 2018-06-09 (×2): qty 1

## 2018-06-09 MED ORDER — MAGNESIUM HYDROXIDE 400 MG/5ML PO SUSP: 30.0000 mL | Freq: Every day | ORAL | Status: DC | PRN

## 2018-06-09 MED ORDER — TRAZODONE HCL 50 MG PO TABS
50.0000 mg | ORAL_TABLET | Freq: Every evening | ORAL | Status: DC | PRN
  Administered 2018-06-09 – 2018-06-10 (×2): 50 mg via ORAL
  Filled 2018-06-09 (×2): qty 1

## 2018-06-09 MED ORDER — ESCITALOPRAM OXALATE 20 MG PO TABS
20.0000 mg | ORAL_TABLET | Freq: Every day | ORAL | Status: DC
  Administered 2018-06-09 – 2018-06-11 (×3): 20 mg via ORAL
  Filled 2018-06-09: qty 1
  Filled 2018-06-09: qty 2
  Filled 2018-06-09 (×4): qty 1

## 2018-06-09 MED ORDER — CEPHALEXIN 500 MG PO CAPS
500.0000 mg | ORAL_CAPSULE | Freq: Three times a day (TID) | ORAL | Status: DC
  Filled 2018-06-09: qty 1

## 2018-06-09 MED ORDER — NICOTINE POLACRILEX 2 MG MT GUM: 2.0000 mg | CHEWING_GUM | OROMUCOSAL | Status: DC | PRN

## 2018-06-09 MED ORDER — CEPHALEXIN 500 MG PO CAPS
500.0000 mg | ORAL_CAPSULE | Freq: Three times a day (TID) | ORAL | Status: DC
  Administered 2018-06-09 – 2018-06-11 (×8): 500 mg via ORAL
  Filled 2018-06-09 (×13): qty 1

## 2018-06-09 MED ORDER — BACITRACIN-NEOMYCIN-POLYMYXIN 400-5-5000 EX OINT: TOPICAL_OINTMENT | Freq: Three times a day (TID) | CUTANEOUS | Status: DC

## 2018-06-09 MED ORDER — BACITRACIN-NEOMYCIN-POLYMYXIN OINTMENT TUBE
TOPICAL_OINTMENT | Freq: Three times a day (TID) | CUTANEOUS | Status: DC
  Administered 2018-06-09 – 2018-06-10 (×5): via TOPICAL
  Filled 2018-06-09 (×3): qty 14.17

## 2018-06-09 MED ORDER — ALUM & MAG HYDROXIDE-SIMETH 200-200-20 MG/5ML PO SUSP: 30.0000 mL | ORAL | Status: DC | PRN

## 2018-06-09 NOTE — Progress Notes (Signed)
D:  Patient's self inventory sheet, patient sleeps good, no sleep medication.  Good appetite, normal energy level, good concentration.  Denied depression and hopeless, anxiety #2.  Denied withdrawals.  Denied SI.  Denied physical problems.  Denied physical pain.  Goal is make myself better for myself and my children.  Plans to do everything that is asked of her.  Does have discharge plans. A:  Medications administered per MD orders.  Emotional support and encouragement given patient. R:  Patient denied SI and HI.  Denied A/V hallucinations.  Safety maintained with 15 minute checks.

## 2018-06-09 NOTE — Progress Notes (Signed)
Adult Psychoeducational Group Note  Date:  06/09/2018 Time:  9:26 PM  Group Topic/Focus:  Wrap-Up Group:   The focus of this group is to help patients review their daily goal of treatment and discuss progress on daily workbooks.  Participation Level:  Active  Participation Quality:  Appropriate  Affect:  Appropriate  Cognitive:  Appropriate  Insight: Appropriate  Engagement in Group:  Engaged  Modes of Intervention:  Discussion  Additional Comments:  Patient attended group and participated.  Jagdeep Ancheta W Garner Dullea 02/09/1186, 9:26 PM

## 2018-06-09 NOTE — Plan of Care (Signed)
Nurse discussed coping skills, anxiety, depression with patient.  

## 2018-06-09 NOTE — [facility] Group Notes (Signed)
[facility] LCSW Group Therapy Note  06/09/2018  10:00-11:00AM  Type of Therapy and Topic:  Group Therapy:  Healthy & Unhealthy Supports, plus Me  Participation Level:  Active   Description of Group:  Patients in this group were introduced to the concept that additional supports including self-support are an essential part of recovery.  Patients listed the supports they currently have and discussed various ways in which these current supports are healthy or unhealthy.  Being a self-support was discussed, and it was emphasized that all supports, whether others or ourselves, can be healthy at times and unhealthy at others.  A song entitled "My Own Hero" was played followed by a group discussion about the necessity to participate in one's own work toward stability instead of relying on others to do all the work.  A song was played called "I Am Enough" which led to a discussion about being willing to believe we are worth the effort of being healthy for oneself.  Therapeutic Goals: 1)  demonstrate the importance of being a key part of one's own support system 2)  encourage patient to become healthier in their own self-support 3)  provide inspiration and relaxation   Summary of Patient Progress:  The patient expressed current healthy supports include her 27yo (almost 9yo) and 1yo children while unhealthy supports include her boyfriend and sister.  She said that at times her whole family can be healthy, while at other times they are unhealthy.  She was very involved in group and seemed to understand the concepts presented.   Therapeutic Modalities:   Motivational Interviewing Activity  Maretta Los
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
## 2018-06-09 NOTE — Plan of Care (Signed)
D: Patient is alert, oriented, and cooperative. Denies SI, HI, AVH, and verbally contracts for safety. Patient reports anxiety. Patient denies physical symptoms/pain.    A: Medications administered per MD order. Support provided. Patient educated on safety on the unit and medications. Routine safety checks every 15 minutes. Patient stated understanding to tell nurse about any new physical symptoms. Patient understands to tell staff of any needs.     R: No adverse drug reactions noted. Patient verbally contracts for safety. Patient remains safe at this time and will continue to monitor.   Problem: Education: Goal: Knowledge of [facility] Education information/materials will improve Outcome: Progressing  Patient oriented tot he unit. Patient remains safe and will continue to monitor.   [place] NOVEL CORONAVIRUS (COVID-19) DAILY CHECK-OFF SYMPTOMS - answer yes or no to each - every day NO YES  Have you had a fever in the past 24 hours?  Fever (Temp > 37.80C / 100F) X   Have you had any of these symptoms in the past 24 hours? New Cough  Sore Throat   Shortness of Breath  Difficulty Breathing  Unexplained Body Aches   X   Have you had any one of these symptoms in the past 24 hours not related to allergies?   Runny Nose  Nasal Congestion  Sneezing   X   If you have had runny nose, nasal congestion, sneezing in the past 24 hours, has it worsened?  X   EXPOSURES - check yes or no X   Have you traveled outside the state in the past 14 days?  X   Have you been in contact with someone with a confirmed diagnosis of COVID-19 or PUI in the past 14 days without wearing appropriate PPE?  X   Have you been living in the same home as a person with confirmed diagnosis of COVID-19 or a PUI (household contact)?    X   Have you been diagnosed with COVID-19?    X              What to do next: Answered NO to all: Answered YES to anything:   Proceed with unit schedule Follow the [facility]  Inpatient Flowsheet.
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
## 2018-06-09 NOTE — ED Notes (Signed)
Pt calm and cooperative. Pt verbalizes an understanding of transfer to [facility]. Pt belongings given to [facility] transportation. Pt stable and transferred to [facility] via [facility].
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
## 2018-06-10 NOTE — Progress Notes (Addendum)
D:  Patient denied SI and HI, contracts for safety.  Denied A/V hallucinations.  Denied pain.  Patient has stayed in bed most of the day, stated she felt very tired, but did sleep good last night. A:  Medications administered per MD orders.  Emotional support and encouragement given patient. R:  Safety maintained with 15 minute checks.  Patient stated she feels a lot better today than she felt yesterday. She has not been on the phone and trying to eliminate some of her stress.  Set a goal today to make sure that she stays on top of things, like her medicine, schedule, etc.  Hopefully she will not have any sleeping medications tonight.  Stated she had anxiety attack last night.

## 2018-06-10 NOTE — Tx Team (Signed)
Interdisciplinary Treatment and Diagnostic Plan Update  06/10/2018 Time of Session:  Anna Decker MRN: [id number]  Principal Diagnosis: <principal problem not specified>  Secondary Diagnoses: Active Problems:   MDD (major depressive disorder), severe (HCC)   Current Medications:  Current Facility-Administered Medications  Medication Dose Route Frequency Provider Last Rate Last Dose  . acetaminophen (TYLENOL) tablet 650 mg  650 mg Oral Q6H PRN Antonieta Pertlary, Greg Lawson, MD      . albuterol (VENTOLIN HFA) 108 (90 Base) MCG/ACT inhaler 1-2 puff  1-2 puff Inhalation QID PRN Antonieta Pertlary, Greg Lawson, MD      . alum & mag hydroxide-simeth (MAALOX/MYLANTA) 200-200-20 MG/5ML suspension 30 mL  30 mL Oral Q4H PRN Money, Gerlene Burdockravis B, FNP      . cephALEXin (KEFLEX) capsule 500 mg  500 mg Oral TID AC Antonieta Pertlary, Greg Lawson, MD   500 mg at 06/10/18 0824  . escitalopram (LEXAPRO) tablet 20 mg  20 mg Oral Daily Antonieta Pertlary, Greg Lawson, MD   20 mg at 06/10/18 [id number]  . hydrOXYzine (ATARAX/VISTARIL) tablet 25 mg  25 mg Oral TID PRN Antonieta Pertlary, Greg Lawson, MD   25 mg at 06/09/18 1831  . magnesium hydroxide (MILK OF MAGNESIA) suspension 30 mL  30 mL Oral Daily PRN Money, Gerlene Burdockravis B, FNP      . neomycin-bacitracin-polymyxin (NEOSPORIN) ointment   Topical TID Antonieta Pertlary, Greg Lawson, MD      . nicotine polacrilex (NICORETTE) gum 2 mg  2 mg Oral PRN Antonieta Pertlary, Greg Lawson, MD      . traZODone (DESYREL) tablet 50 mg  50 mg Oral QHS PRN Antonieta Pertlary, Greg Lawson, MD   50 mg at 06/09/18 2314   PTA Medications: Medications Prior to Admission  Medication Sig Dispense Refill Last Dose  . albuterol (PROVENTIL HFA;VENTOLIN HFA) 108 (90 Base) MCG/ACT inhaler Inhale 2 puffs into the lungs every 6 (six) hours as needed for wheezing or shortness of breath.   unk  . buPROPion (WELLBUTRIN SR) 150 MG 12 hr tablet Take 150 mg by mouth daily.   over a month  . cephALEXin (KEFLEX) 500 MG capsule Take 1 capsule (500 mg total) by mouth 4 (four) times daily. 20 capsule 0    . escitalopram (LEXAPRO) 10 MG tablet Take 10 mg by mouth daily.   over a month  . ibuprofen (ADVIL,MOTRIN) 200 MG tablet Take 600 mg by mouth daily as needed for headache or moderate pain.   Past Month at Unknown time  . meloxicam (MOBIC) 15 MG tablet Take 15 mg by mouth daily.   Past Week at Unknown time  . naproxen (NAPROSYN) 500 MG tablet Take 500 mg by mouth 2 (two) times daily as needed for mild pain or moderate pain.    Past Month at Unknown time  . ondansetron (ZOFRAN ODT) 8 MG disintegrating tablet Take 1 tablet (8 mg total) by mouth every 8 (eight) hours as needed for nausea or vomiting. (Patient not taking: Reported on 06/08/2018) 10 tablet 0 Completed Course at Unknown time    Patient Stressors: Marital or family conflict Medication change or noncompliance Substance abuse  Patient Strengths: Average or above average intelligence Communication skills General fund of knowledge Motivation for treatment/growth Physical Health Supportive family/friends Work skills  Treatment Modalities: Medication Management, Group therapy, Case management,  1 to 1 session with clinician, Psychoeducation, Recreational therapy.   Physician Treatment Plan for Primary Diagnosis: <principal problem not specified> Long Term Goal(s): Improvement in symptoms so as ready for discharge Improvement in symptoms so as  ready for discharge   Short Term Goals: Ability to identify changes in lifestyle to reduce recurrence of condition will improve Ability to verbalize feelings will improve Ability to disclose and discuss suicidal ideas Ability to demonstrate self-control will improve Ability to identify and develop effective coping behaviors will improve Ability to maintain clinical measurements within normal limits will improve Ability to identify triggers associated with substance abuse/mental health issues will improve Ability to identify changes in lifestyle to reduce recurrence of condition will  improve Ability to verbalize feelings will improve Ability to disclose and discuss suicidal ideas Ability to demonstrate self-control will improve Ability to identify and develop effective coping behaviors will improve Ability to maintain clinical measurements within normal limits will improve Ability to identify triggers associated with substance abuse/mental health issues will improve  Medication Management: Evaluate patient's response, side effects, and tolerance of medication regimen.  Therapeutic Interventions: 1 to 1 sessions, Unit Group sessions and Medication administration.  Evaluation of Outcomes: Progressing  Physician Treatment Plan for Secondary Diagnosis: Active Problems:   MDD (major depressive disorder), severe (HCC)  Long Term Goal(s): Improvement in symptoms so as ready for discharge Improvement in symptoms so as ready for discharge   Short Term Goals: Ability to identify changes in lifestyle to reduce recurrence of condition will improve Ability to verbalize feelings will improve Ability to disclose and discuss suicidal ideas Ability to demonstrate self-control will improve Ability to identify and develop effective coping behaviors will improve Ability to maintain clinical measurements within normal limits will improve Ability to identify triggers associated with substance abuse/mental health issues will improve Ability to identify changes in lifestyle to reduce recurrence of condition will improve Ability to verbalize feelings will improve Ability to disclose and discuss suicidal ideas Ability to demonstrate self-control will improve Ability to identify and develop effective coping behaviors will improve Ability to maintain clinical measurements within normal limits will improve Ability to identify triggers associated with substance abuse/mental health issues will improve     Medication Management: Evaluate patient's response, side effects, and tolerance of  medication regimen.  Therapeutic Interventions: 1 to 1 sessions, Unit Group sessions and Medication administration.  Evaluation of Outcomes: Progressing   RN Treatment Plan for Primary Diagnosis: <principal problem not specified> Long Term Goal(s): Knowledge of disease and therapeutic regimen to maintain health will improve  Short Term Goals: Ability to verbalize frustration and anger appropriately will improve, Ability to participate in decision making will improve, Ability to verbalize feelings will improve, Ability to disclose and discuss suicidal ideas, Ability to identify and develop effective coping behaviors will improve and Compliance with prescribed medications will improve  Medication Management: RN will administer medications as ordered by provider, will assess and evaluate patient's response and provide education to patient for prescribed medication. RN will report any adverse and/or side effects to prescribing provider.  Therapeutic Interventions: 1 on 1 counseling sessions, Psychoeducation, Medication administration, Evaluate responses to treatment, Monitor vital signs and CBGs as ordered, Perform/monitor CIWA, COWS, AIMS and Fall Risk screenings as ordered, Perform wound care treatments as ordered.  Evaluation of Outcomes: Progressing   LCSW Treatment Plan for Primary Diagnosis: <principal problem not specified> Long Term Goal(s): Safe transition to appropriate next level of care at discharge, Engage patient in therapeutic group addressing interpersonal concerns.  Short Term Goals: Engage patient in aftercare planning with referrals and resources  Therapeutic Interventions: Assess for all discharge needs, 1 to 1 time with Social worker, Explore available resources and support systems, Assess for adequacy in  community support network, Educate family and significant other(s) on suicide prevention, Complete Psychosocial Assessment, Interpersonal group therapy.  Evaluation of  Outcomes: Adequate for Discharge   Progress in Treatment: Attending groups: Yes. Participating in groups: Yes. Taking medication as prescribed: Yes. Toleration medication: Yes. Family/Significant other contact made: No, will contact:  patient declined consent at this time Patient understands diagnosis: Yes. Discussing patient identified problems/goals with staff: Yes. Medical problems stabilized or resolved: Yes. Denies suicidal/homicidal ideation: Yes. Issues/concerns per patient self-inventory: No. Other:   New problem(s) identified: None   New Short Term/Long Term Goal(s): medication stabilization, elimination of SI thoughts, development of comprehensive mental wellness plan.    Patient Goals:    Discharge Plan or Barriers:  CSW will continue to assess for appropriate referrals and possible discharge planning.   Reason for Continuation of Hospitalization: Anxiety Depression Medication stabilization  Estimated Length of Stay: 3-5 days   Attendees: Patient: 06/10/2018 10:34 AM  Physician: Dr. Myles Lipps, MD 06/10/2018 10:34 AM  Nursing: Rise Paganini.Raliegh Ip, RN 06/10/2018 10:34 AM  RN Care Manager: 06/10/2018 10:34 AM  Social Worker: Radonna Ricker, [place] 06/10/2018 10:34 AM  Recreational Therapist:  06/10/2018 10:34 AM  Other:  06/10/2018 10:34 AM  Other:  06/10/2018 10:34 AM  Other: 06/10/2018 10:34 AM    Scribe for Treatment Team: Marylee Floras, [place] 06/10/2018 10:34 AM

## 2018-06-10 NOTE — Progress Notes (Signed)
Psychoeducational Group Note  Date:  06/10/2018 Time:  1054  Group Topic/Focus:  Developing a Wellness Toolbox:   The focus of this group is to help patients develop a "wellness toolbox" with skills and strategies to promote recovery upon discharge.  Participation Level: Did Not Attend  Participation Quality:  Not Applicable  Affect:  Not Applicable  Cognitive:  Not Applicable  Insight:  Not Applicable  Engagement in Group: Not Applicable  Additional Comments:  Pt was asleep and could not attend group this morning.  Paysen Goza E 06/10/2018, 10:57 AM
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
## 2018-06-10 NOTE — [facility] Suicide Risk Assessment (Addendum)
[facility] INPATIENT:  Family/Significant Other Suicide Prevention Education  Suicide Prevention Education:   Patient Refusal for Family/Significant Other Suicide Prevention Education: The patient Anna Decker has refused to provide written consent for family/significant other to be provided Family/Significant Other Suicide Prevention Education during admission and/or prior to discharge.  Physician notified.  SPE completed with patient, as patient refused to consent to family contact. SPI pamphlet provided to pt and pt was encouraged to share information with support network, ask questions, and talk about any concerns relating to SPE. Patient denies access to guns/firearms and verbalized understanding of information provided. Mobile Crisis information also provided to patient.    Radonna Ricker, MSW, [place] Clinical Social [facility]  Phone: [place] 06/10/2018, 9:50 AM

## 2018-06-10 NOTE — Progress Notes (Signed)
D: Pt denies SI/HI/AVH. Pt is pleasant and cooperative. Pt stated she needs work on her anger. Pt stated she felt a lot better.  A: Pt was offered support and encouragement. Pt was given scheduled medications. Pt was encourage to attend groups. Q 15 minute checks were done for safety.  R:Pt attends groups and interacts well with peers and staff. Pt is taking medication. Pt has no complaints.Pt receptive to treatment and safety maintained on unit.

## 2018-06-11 DIAGNOSIS — F322 Major depressive disorder, single episode, severe without psychotic features: Principal | ICD-10-CM

## 2018-06-11 MED ORDER — NICOTINE POLACRILEX 2 MG MT GUM: 2.0000 mg | CHEWING_GUM | OROMUCOSAL | 0 refills | Status: AC | PRN

## 2018-06-11 MED ORDER — BACITRACIN-NEOMYCIN-POLYMYXIN OINTMENT TUBE: 1.0000 "application " | TOPICAL_OINTMENT | Freq: Three times a day (TID) | CUTANEOUS | Status: AC

## 2018-06-11 MED ORDER — ESCITALOPRAM OXALATE 20 MG PO TABS: 20.0000 mg | ORAL_TABLET | Freq: Every day | ORAL | 0 refills | Status: AC

## 2018-06-11 MED ORDER — TRAZODONE HCL 50 MG PO TABS: 50.0000 mg | ORAL_TABLET | Freq: Every evening | ORAL | 0 refills | Status: AC | PRN

## 2018-06-11 MED ORDER — HYDROXYZINE HCL 25 MG PO TABS: 25.0000 mg | ORAL_TABLET | Freq: Three times a day (TID) | ORAL | 0 refills | Status: AC | PRN

## 2018-06-11 NOTE — Progress Notes (Signed)
Pt attended spiritual care group on grief and loss facilitated by chaplain Viva Gallaher  Group Goal:  Support / Education around grief and loss Members engage in facilitated group support and psycho social education.  Group Description:  Following introductions and group rules,  Group members engaged in facilitated group dialog and support around topic of loss, with particular support around experiences of loss in their lives. Group Identified types of loss (relationships / self / things) and identified patterns, circumstances, and changes that precipitate losses. Reflected on thoughts / feelings around loss, normalized grief responses, and recognized variety in grief experience. Patient Progress:  

## 2018-06-11 NOTE — Progress Notes (Signed)
Patient ID: Anna Decker, female   DOB: 1990-05-11, 28 y.o.   MRN: [id number]  Discharge Note  Patient denies SI/HI and states readiness for discharge.  Written and verbal discharge instructions reviewed with the patient. Patient accepting to information and verbalized understanding with no concerns. All belongings returned to patient from the unit and secured lockers. Patient has completed their Suicide Safety Plan and has been provided Suicide Prevention resources. Patient provided an opportunity to complete and return Patient Satisfaction Survey.   Patient was safely escorted to the lobby for discharge. Patient discharged from [facility] with prescriptions, personal belongings, follow-up appointment in place and discharge paperwork.

## 2018-06-11 NOTE — Progress Notes (Signed)
  [facility] Adult Case Management Discharge Plan :  Will you be returning to the same living situation after discharge:  Yes,  patient reports she is returning home with her significant other At discharge, do you have transportation home?: Yes,  patient reports her boyfriend is picking her up at discharge Do you have the ability to pay for your medications: Yes,  Medicaid  Release of information consent forms completed and in the chart;  Patient's signature needed at discharge.  Patient to Follow up at: Follow-up Information    [facility] Follow up on 06/17/2018.   Why:  Please attend your therapy appointment with Omar Person on Monday, 6/15 at 4:00p.  Contact information: [place] [address] Ph: [phone] Fx: [phone]       [facility] Follow up on 06/13/2018.   Why:  Telephonic hospital discharge appointment is Thursday, 6/11 at 10:30a. The provider will contact you for appointment.  Contact information: [place] [address] [phone]           Next level of care provider has access to [employer] and Suicide Prevention discussed: Yes,  with the patient  Have you used any form of tobacco in the last 30 days? (Cigarettes, Smokeless Tobacco, Cigars, and/or Pipes): Yes  Has patient been referred to the Quitline?: Patient refused referral  Patient has been referred for addiction treatment: N/A  Marylee Floras, [place] 06/11/2018, 1:55 PM
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
## 2018-06-11 NOTE — [facility] Suicide Risk Assessment (Signed)
[facility] Discharge Suicide Risk Assessment   Principal Problem: Depression Discharge Diagnoses: Active Problems:   MDD (major depressive disorder), severe ([place])   Total Time spent with patient: 30 minutes  Musculoskeletal: Strength & Muscle Tone: within normal limits Gait & Station: normal Patient leans: N/A  Psychiatric Specialty Exam: ROS denies chest pain, denies dyspnea, denies cough, no fever, no chills  Blood pressure 122/78, pulse (!) 102, temperature 97.8 F (36.6 C), temperature source Oral, resp. rate 18, height 5\' 2"  (1.575 m), weight 80.7 kg, SpO2 96 %.Body mass index is 32.56 kg/m.  General Appearance: Well Groomed  Eye Contact::  Good  Speech:  Normal Rate409  Volume:  Normal  Mood:  Euthymic, states "I feel a lot better than when I came in"  Affect:  Full range/bright affect  Thought Process:  Linear and Descriptions of Associations: Intact  Orientation:  Full (Time, Place, and Person)  Thought Content:  No hallucinations, no delusions  Suicidal Thoughts:  No no suicidal or self-injurious ideations  Homicidal Thoughts:  No  Memory:  Recent and remote grossly intact  Judgement:  Other:  Improving  Insight:  Fair/improving  Psychomotor Activity:  Normal  Concentration:  Good  Recall:  Good  Fund of Knowledge:Good  Language: Good  Akathisia:  Negative  Handed:  Left  AIMS (if indicated):     Assets:  Communication Skills Desire for Improvement Resilience  Sleep:  Number of Hours: 6.75  Cognition: WNL  ADL's:  Intact   Mental Status Per Nursing Assessment::   On Admission:  Suicidal ideation indicated by others, Suicide plan, Plan includes specific time, place, or method  Demographic Factors:  28 year old Decker, lives with boyfriend and child  Loss Factors: Relationship stressors, loss of child 3 years ago  Historical Factors: History of depression, history of alcohol use in binges  Risk Reduction Factors:   Responsible for children under 88 years  of age, Sense of responsibility to family, Living with another person, especially a relative and Positive coping skills or problem solving skills  Continued Clinical Symptoms:  At this time patient presents alert, attentive, well related, pleasant, euthymic, with a full range of affect.  No thought disorder.  Denies suicidal or self-injurious ideations.  No homicidal ideations.  No hallucinations, no delusions.  Future oriented. Behavior on unit calm and in good control, pleasant on approach, visible in dayroom, interacting appropriately with peers.. Denies medication side effects at this time.  No symptoms of alcohol withdrawal, presents calm/comfortable, in no acute distress.  Cognitive Features That Contribute To Risk:  No gross cognitive deficits noted upon discharge. Is alert , attentive, and oriented x 3   Suicide Risk:  Mild:  Suicidal ideation of limited frequency, intensity, duration, and specificity.  There are no identifiable plans, no associated intent, mild dysphoria and related symptoms, good self-control (both objective and subjective assessment), few other risk factors, and identifiable protective factors, including available and accessible social support.  Follow-up Information    [facility] Follow up on 06/17/2018.   Why:  Please attend your therapy appointment with Omar Person on Monday, 6/15 at 4:00p.  Contact information: [place] [address] Ph: [phone] Fx: [phone]          Plan Of Care/Follow-up recommendations:  Activity:  as tolerated Diet:  regular Tests:  NA Other:  See below  Patient is expressing readiness for discharge and there are no current grounds for involuntary commitment. She is leaving unit in good spirits,  plans to return home.  Plans to follow-up as above.  Reports she is motivated in sobriety/abstinence from alcohol at this time.  She has an established PCP for medical issues as needed.  Craige CottaFernando A  Cobos, MD 06/11/2018, 11:29 AM

## 2018-06-11 NOTE — Progress Notes (Signed)
Patient ID: Anna Decker, female   DOB: 1990-12-27, 28 y.o.   MRN: [id number]  [place] NOVEL CORONAVIRUS (COVID-19) DAILY CHECK-OFF SYMPTOMS - answer yes or no to each - every day NO YES  Have you had a fever in the past 24 hours?  . Fever (Temp > 37.80C / 100F) X   Have you had any of these symptoms in the past 24 hours? . New Cough .  Sore Throat  .  Shortness of Breath .  Difficulty Breathing .  Unexplained Body Aches   X   Have you had any one of these symptoms in the past 24 hours not related to allergies?   . Runny Nose .  Nasal Congestion .  Sneezing   X   If you have had runny nose, nasal congestion, sneezing in the past 24 hours, has it worsened?  X   EXPOSURES - check yes or no X   Have you traveled outside the state in the past 14 days?  X   Have you been in contact with someone with a confirmed diagnosis of COVID-19 or PUI in the past 14 days without wearing appropriate PPE?  X   Have you been living in the same home as a person with confirmed diagnosis of COVID-19 or a PUI (household contact)?    X   Have you been diagnosed with COVID-19?    X              What to do next: Answered NO to all: Answered YES to anything:   Proceed with unit schedule Follow the [facility] Inpatient Flowsheet.

## 2020-02-21 IMAGING — CR RIGHT FOOT COMPLETE - 3+ VIEW
3 series · 3 of 3 positions shown · non-contrast
Comparison: None.

CLINICAL DATA: Patient stepped on glass

EXAM:
RIGHT FOOT COMPLETE - 3+ VIEW

[x foot ap right]
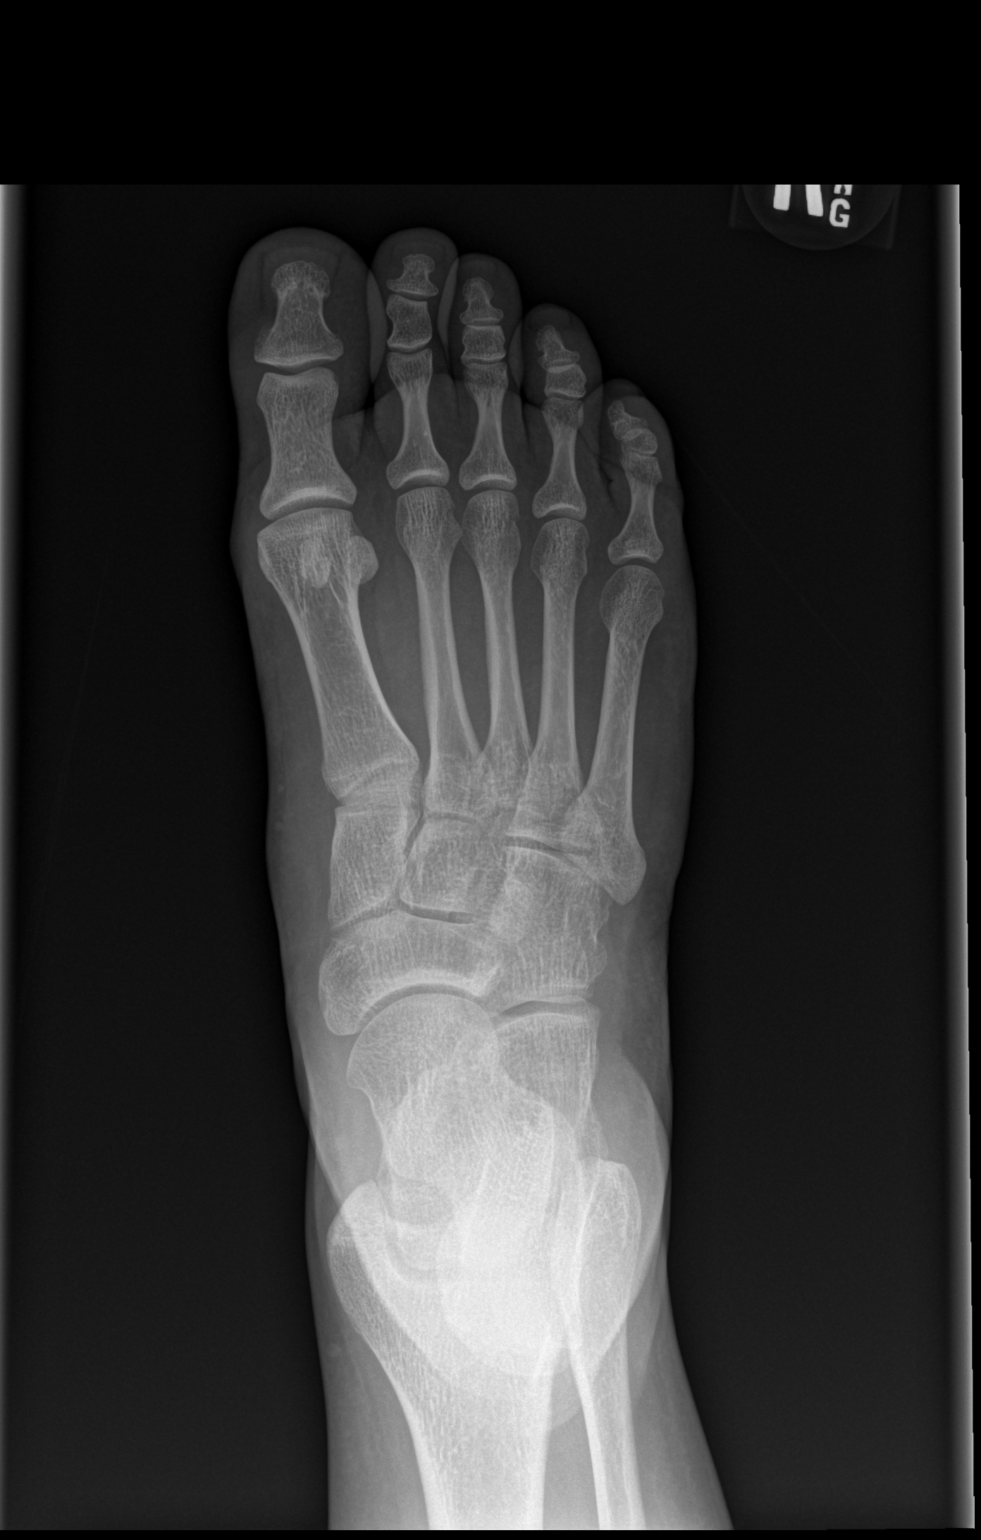

[x foot obl right]
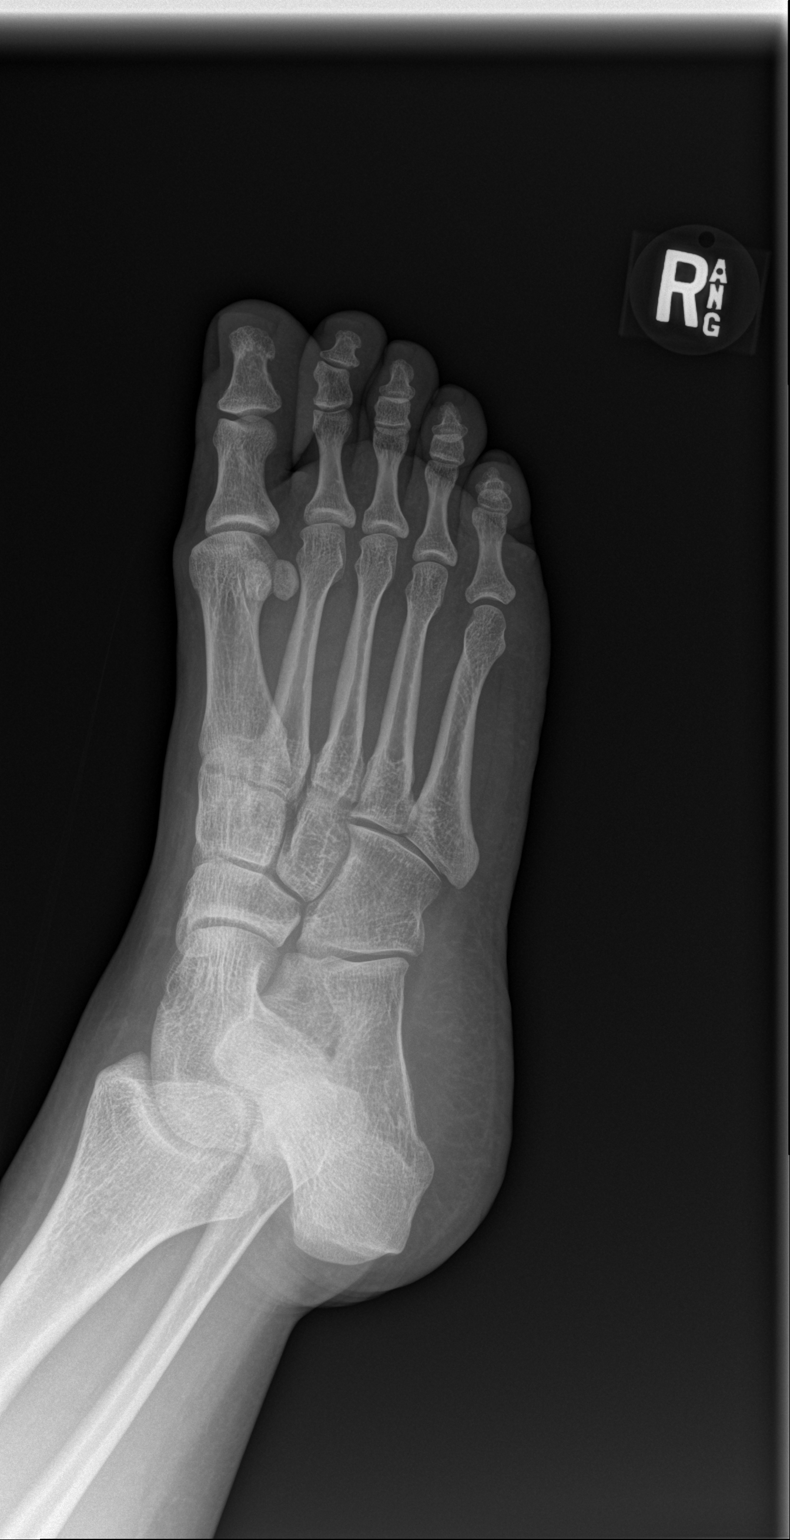

[x foot lat right]
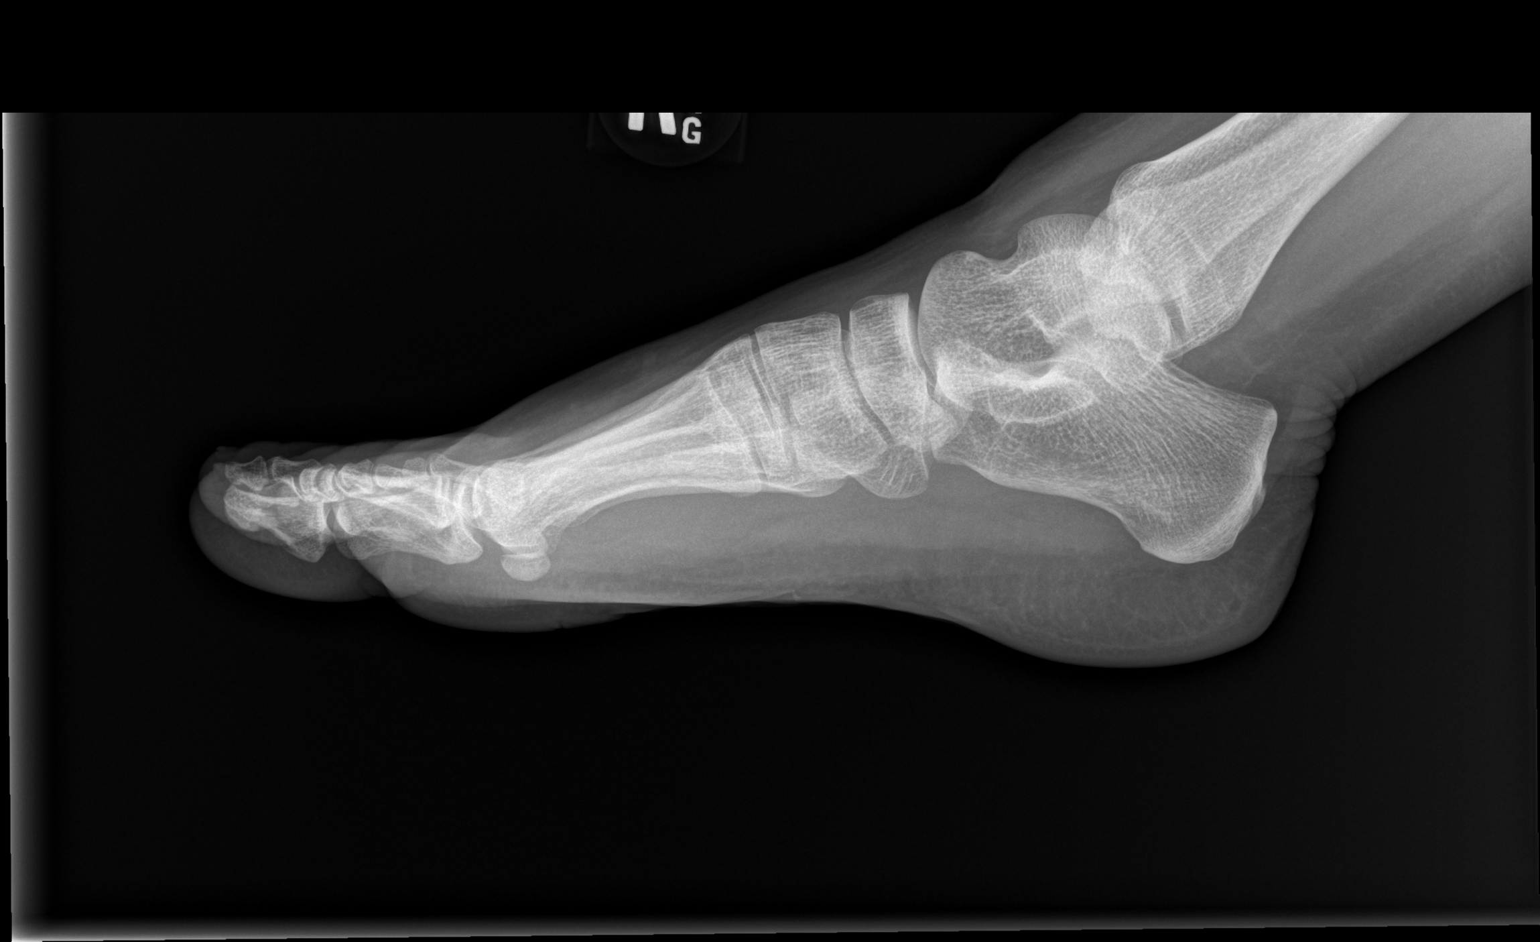

[3 of 3 positions shown; findings below may reference images not displayed]

FINDINGS: Frontal, oblique, and lateral views obtained. There is a 2 mm in
length focus of increased opacity along the superficial portion of
the volar aspect of the foot at the tarsal-metatarsal level, seen
only on the lateral view. No other evident radiopaque foreign body.
No fracture or dislocation. Joint spaces appear normal. No erosive
change.
IMPRESSION: Question small glass fragment in the superficial soft tissues volar
to the tarsal-metatarsal joint level, seen only on the lateral view.
No bony abnormality. No arthropathy.

## 2023-09-25 ENCOUNTER — Emergency Department (HOSPITAL_BASED_OUTPATIENT_CLINIC_OR_DEPARTMENT_OTHER)

## 2023-09-25 ENCOUNTER — Emergency Department (HOSPITAL_BASED_OUTPATIENT_CLINIC_OR_DEPARTMENT_OTHER)
Admission: EM | Admit: 2023-09-25 | Discharge: 2023-09-25 | Disposition: A | Discharge status: Home / Self Care | Attending: Emergency Medicine | Admitting: Emergency Medicine

## 2023-09-25 ENCOUNTER — Other Ambulatory Visit: Payer: Self-pay

## 2023-09-25 ENCOUNTER — Encounter (HOSPITAL_BASED_OUTPATIENT_CLINIC_OR_DEPARTMENT_OTHER): Payer: Self-pay | Admitting: *Deleted

## 2023-09-25 DIAGNOSIS — S99922A Unspecified injury of left foot, initial encounter: Secondary | ICD-10-CM | POA: Diagnosis present

## 2023-09-25 DIAGNOSIS — S91332A Puncture wound without foreign body, left foot, initial encounter: Secondary | ICD-10-CM | POA: Insufficient documentation

## 2023-09-25 DIAGNOSIS — I10 Essential (primary) hypertension: Secondary | ICD-10-CM | POA: Diagnosis not present

## 2023-09-25 DIAGNOSIS — T148XXA Other injury of unspecified body region, initial encounter: Secondary | ICD-10-CM

## 2023-09-25 DIAGNOSIS — Z91040 Latex allergy status: Secondary | ICD-10-CM | POA: Insufficient documentation

## 2023-09-25 DIAGNOSIS — W458XXA Other foreign body or object entering through skin, initial encounter: Secondary | ICD-10-CM | POA: Diagnosis not present

## 2023-09-25 MED ORDER — CEPHALEXIN 500 MG PO CAPS: 500.0000 mg | ORAL_CAPSULE | Freq: Two times a day (BID) | ORAL | 0 refills | Status: AC

## 2023-09-25 NOTE — ED Triage Notes (Signed)
 Here by POV from home for L foot injury described as had mulch in my shoe that puncture my sole. C/o pain and swelling. Unsure of f/b. Took tylenol  at 1330. Occurred yesterday around 1030. Alert, NAD, calm, interactive, steady gait. Concerned about infection.

## 2023-09-25 NOTE — ED Provider Notes (Signed)
 [place] EMERGENCY DEPARTMENT AT [facility] Provider Note   CSN: [id number] Arrival date & time: 09/25/23  1657     Patient presents with: Foot Injury   Anna Decker is a 33 y.o. female.   Patient here with concern for foreign body in her left foot.  She had some mulch in her shoe that punctured.  She has had some pain.  This occurred yesterday.  Tetanus shot is up-to-date.  She denies any fever or chills.  History of hypertension.  The history is provided by the patient.       Prior to Admission medications   Medication Sig Start Date End Date Taking? Authorizing Provider  cephALEXin  (KEFLEX ) 500 MG capsule Take 1 capsule (500 mg total) by mouth 2 (two) times daily for 5 days. 09/25/23 09/30/23 Yes Lavern Crimi, DO  albuterol  (PROVENTIL  HFA;VENTOLIN  HFA) 108 (90 Base) MCG/ACT inhaler Inhale 2 puffs into the lungs every 6 (six) hours as needed for wheezing or shortness of breath.    [provider]  escitalopram  (LEXAPRO ) 20 MG tablet Take 1 tablet (20 mg total) by mouth daily. 06/12/18   Wonda Clarita BRAVO, NP  hydrOXYzine  (ATARAX /VISTARIL ) 25 MG tablet Take 1 tablet (25 mg total) by mouth 3 (three) times daily as needed for anxiety. 06/11/18   Sykes, Janet E, NP  neomycin -bacitracin -polymyxin (NEOSPORIN) OINT Apply 1 application topically 3 (three) times daily. 06/11/18   Wonda Clarita BRAVO, NP  nicotine  polacrilex (NICORETTE ) 2 MG gum Take 1 each (2 mg total) by mouth as needed for smoking cessation. 06/11/18   Wonda Clarita BRAVO, NP  traZODone  (DESYREL ) 50 MG tablet Take 1 tablet (50 mg total) by mouth at bedtime as needed for sleep. 06/11/18   Wonda Clarita BRAVO, NP    Allergies: Latex and Lemon oil    Review of Systems  Updated Vital Signs BP (!) 139/95 (BP Location: Left Arm)   Pulse 84   Temp 98.2 F (36.8 C) (Oral)   Resp 17   Ht 5' 2 (1.575 m)   Wt 88.9 kg   LMP 09/22/2023 (Approximate)   SpO2 100%   BMI 35.85 kg/m   Physical Exam Vitals and nursing  note reviewed.  Constitutional:      General: She is not in acute distress.    Appearance: She is not ill-appearing.  Cardiovascular:     Pulses: Normal pulses.  Skin:    Comments: I do not see any obvious foreign retained body, when I washed out puncture site area may be 0.1 mm puncture and some dirt came out  Neurological:     General: No focal deficit present.     Mental Status: She is alert.     Sensory: No sensory deficit.     Motor: No weakness.     (all labs ordered are listed, but only abnormal results are displayed) Labs Reviewed - No data to display  EKG: None  Radiology: DG Foot Complete Left Result Date: 09/25/2023 CLINICAL DATA:  puncture, r/o f/b, mulch/wood, pain and swelling EXAM: LEFT FOOT - COMPLETE 3+ VIEW COMPARISON:  None Available. FINDINGS: No acute fracture or dislocation. There is no evidence of arthropathy or other focal bone abnormality. Soft tissues are unremarkable. No radiopaque foreign body. IMPRESSION: No acute fracture or dislocation. No radiopaque foreign body. Electronically Signed   By: Rogelia Myers M.D.   On: 09/25/2023 17:35     Procedures   Medications Ordered in the ED - No data to display  Medical Decision Making Amount and/or Complexity of Data Reviewed Radiology: ordered.  Risk Prescription drug management.   Anna Decker is here for concern for splinter.  She got some mulch in her shoe and might have punctured the bottom of her left foot yesterday.  She has 1 mm small puncture area underneath the left foot when I wash just out from dirt came out but I did not see any obvious splinter.  I suspect this might of already came out or she might of just gotten a small cut from a piece of wood.  X-ray was unremarkable.  I do not see any signs of major infectious process.  I recommend soap and water daily and we will put her on Keflex  for a few days.  She is neurovascular neuromuscular intact on  exam.  I do not think there is any major infectious process at this time but she was told to return if that develop.  Patient discharged in good condition.  Tetanus shot already up-to-date.  Unremarkable vitals.  This chart was dictated using voice recognition software.  Despite best efforts to proofread,  errors can occur which can change the documentation meaning.      Final diagnoses:  Splinter    ED Discharge Orders          Ordered    cephALEXin  (KEFLEX ) 500 MG capsule  2 times daily        09/25/23 1827               Ruthe Cornet, DO 09/25/23 1830

## 2023-09-25 NOTE — Discharge Instructions (Addendum)
 Recommend over-the-counter Tylenol  and ibuprofen  for pain.  Take antibiotic as prescribed to be conservative.  Return if symptoms worsen.  Wash daily with soap and water.  You can place bacitracin  or Neosporin ointment on this area twice daily if you would like to as well.  Please keep this area clean and dry.

## 2024-01-04 ENCOUNTER — Encounter: Admitting: Obstetrics and Gynecology

## 2024-01-07 ENCOUNTER — Encounter: Payer: Self-pay | Admitting: Physician Assistant

## 2024-01-07 ENCOUNTER — Other Ambulatory Visit (HOSPITAL_COMMUNITY)
Admission: RE | Admit: 2024-01-07 | Discharge: 2024-01-07 | Disposition: A | Discharge status: Home / Self Care | Source: Ambulatory Visit | Attending: Physician Assistant | Admitting: Physician Assistant

## 2024-01-07 ENCOUNTER — Ambulatory Visit: Admitting: Physician Assistant

## 2024-01-07 VITALS — BP 112/71 | HR 90 | Ht 63.0 in | Wt 191.3 lb

## 2024-01-07 DIAGNOSIS — Z124 Encounter for screening for malignant neoplasm of cervix: Secondary | ICD-10-CM

## 2024-01-07 DIAGNOSIS — Z30431 Encounter for routine checking of intrauterine contraceptive device: Secondary | ICD-10-CM

## 2024-01-07 DIAGNOSIS — Z113 Encounter for screening for infections with a predominantly sexual mode of transmission: Secondary | ICD-10-CM | POA: Diagnosis present

## 2024-01-07 NOTE — Progress Notes (Signed)
 GYNECOLOGY  VISIT   HPI: Anna Decker is a 34 y.o.  divorced  female [phone] here for STI screening and removal of mirena IUD, placed in 2019 with [facility].   GYNECOLOGIC HISTORY: No LMP recorded (lmp unknown). (Menstrual status: IUD). Contraception: IUD Menopausal hormone therapy:  premenopausal Last mammogram:  never done due to age Last pap smear: Patient cannot recall time of last pap         OB History     Gravida  4   Para  1   Term  1   Preterm      AB  2   Living  1      SAB  2   IAB      Ectopic      Multiple      Live Births  1              Patient Active Problem List   Diagnosis Date Noted   MDD (major depressive disorder), severe (HCC) 06/09/2018   S/P laparoscopic cholecystectomy May 2019 05/25/2017    Past Medical History:  Diagnosis Date   ADHD    Anxiety    Asthma    Depression    Gallstones    GERD (gastroesophageal reflux disease)    Hypertension    with this pregnancy    Past Surgical History:  Procedure Laterality Date   CHOLECYSTECTOMY N/A 05/25/2017   Procedure: LAPAROSCOPIC CHOLECYSTECTOMY WITH INTRAOPERATIVE CHOLANGIOGRAM;  Surgeon: Gladis Cough, MD;  Location: [facility];  Service: General;  Laterality: N/A;   DILATION AND CURETTAGE OF UTERUS  12/31/2015   missed abortion   TONSILLECTOMY     WISDOM TOOTH EXTRACTION      Current Outpatient Medications  Medication Sig Dispense Refill   albuterol  (PROVENTIL  HFA;VENTOLIN  HFA) 108 (90 Base) MCG/ACT inhaler Inhale 2 puffs into the lungs every 6 (six) hours as needed for wheezing or shortness of breath.     escitalopram  (LEXAPRO ) 20 MG tablet Take 1 tablet (20 mg total) by mouth daily. (Patient not taking: Reported on 01/07/2024) 30 tablet 0   hydrOXYzine  (ATARAX /VISTARIL ) 25 MG tablet Take 1 tablet (25 mg total) by mouth 3 (three) times daily as needed for anxiety. (Patient not taking: Reported on 01/07/2024) 30 tablet 0   neomycin -bacitracin -polymyxin (NEOSPORIN)  OINT Apply 1 application topically 3 (three) times daily. (Patient not taking: Reported on 01/07/2024)     nicotine  polacrilex (NICORETTE ) 2 MG gum Take 1 each (2 mg total) by mouth as needed for smoking cessation. (Patient not taking: Reported on 01/07/2024) 100 tablet 0   traZODone  (DESYREL ) 50 MG tablet Take 1 tablet (50 mg total) by mouth at bedtime as needed for sleep. (Patient not taking: Reported on 01/07/2024) 30 tablet 0   No current facility-administered medications for this visit.     ALLERGIES: Latex and Lemon oil  Family History  Problem Relation Age of Onset   Diabetes Father    Hypertension Father    Healthy Mother     Social History   Socioeconomic History   Marital status: Divorced    Spouse name: Not on file   Number of children: Not on file   Years of education: Not on file   Highest education level: Not on file  Occupational History   Not on file  Tobacco Use   Smoking status: Some Days    Current packs/day: 0.50    Average packs/day: 0.5 packs/day for 6.0 years (3.0 ttl pk-yrs)  Types: Cigarettes   Smokeless tobacco: Never  Vaping Use   Vaping status: Never Used  Substance and Sexual Activity   Alcohol use: Yes    Comment: OCC   Drug use: No   Sexual activity: Yes    Birth control/protection: I.U.D.    Comment: IUD placement scheduled for 05/22/2017  Other Topics Concern   Not on file  Social History Narrative   Not on file   Social Drivers of Health   Tobacco Use: High Risk (01/07/2024)   Patient History    Smoking Tobacco Use: Some Days    Smokeless Tobacco Use: Never    Passive Exposure: Not on file  Financial Resource Strain: Not on file  Food Insecurity: Not on file  Transportation Needs: Not on file  Physical Activity: Not on file  Stress: Not on file  Social Connections: Not on file  Intimate Partner Violence: Not on file  Depression (PHQ2-9): Low Risk (01/07/2024)   Depression (PHQ2-9)    PHQ-2 Score: 4  Alcohol Screen: Not on file   Housing: Not on file  Utilities: Not on file  Health Literacy: Not on file    Review of Systems  PHYSICAL EXAMINATION:    BP 112/71   Pulse 90   Ht 5' 3 (1.6 m)   Wt 191 lb 4.8 oz (86.8 kg)   LMP  (LMP Unknown)   BMI 33.89 kg/m     General appearance: alert, cooperative and appears stated age Head: Normocephalic, without obvious abnormality, atraumatic Lungs: normal respiratory effort Skin: Skin color, texture, turgor normal. No rashes or lesions Lymph nodes: No abnormal inguinal nodes palpated Neurologic: Grossly normal  Pelvic: External genitalia:  no lesions              Urethra:  normal appearing urethra with no masses, tenderness or lesions              Bartholins and Skenes: normal                 Vagina: normal appearing vagina with normal color and discharge, no lesions              Cervix: +IUD strings visualized. No lesions               Chaperone was present for exam  ASSESSMENT & PLAN   1. Screen for STD (sexually transmitted disease) (Primary) - Cervicovaginal ancillary only( [place]) - HIV antibody (with reflex) - Hepatitis C Antibody - Hepatitis B Surface AntiGEN - RPR  2. Cervical cancer screening - Cytology - PAP   An After Visit Summary was printed and given to the patient.  Anna Decker E Zaeda Mcferran, PA-C 1/5/20263:14 PM

## 2024-01-07 NOTE — Progress Notes (Signed)
 Pt has IUD; Mirena; Since 05/22/2017. Irregular cycle.   Pt on a weight loss pill, but she is unsure of the name.   Pt scored a 5 on GAD; states she already sees a therapist.

## 2024-01-08 LAB — SYPHILIS: RPR W/REFLEX TO RPR TITER AND TREPONEMAL ANTIBODIES, TRADITIONAL SCREENING AND DIAGNOSIS ALGORITHM: RPR Ser Ql: NONREACTIVE

## 2024-01-08 LAB — HEPATITIS B SURFACE ANTIGEN: Hepatitis B Surface Ag: NEGATIVE

## 2024-01-08 LAB — CERVICOVAGINAL ANCILLARY ONLY
Bacterial Vaginitis (gardnerella): NEGATIVE
Candida Glabrata: NEGATIVE
Candida Vaginitis: POSITIVE — AB
Chlamydia: NEGATIVE
Comment: NEGATIVE
Comment: NEGATIVE
Comment: NEGATIVE
Comment: NEGATIVE
Comment: NEGATIVE
Comment: NORMAL
Neisseria Gonorrhea: NEGATIVE
Trichomonas: NEGATIVE

## 2024-01-08 LAB — HEPATITIS C ANTIBODY: Hep C Virus Ab: NONREACTIVE

## 2024-01-08 LAB — HIV ANTIBODY (ROUTINE TESTING W REFLEX): HIV Screen 4th Generation wRfx: NONREACTIVE

## 2024-01-11 LAB — CYTOLOGY - PAP
Comment: NEGATIVE
Comment: NEGATIVE
Comment: NEGATIVE
HPV 16: NEGATIVE
HPV 18 / 45: NEGATIVE
High risk HPV: POSITIVE — AB

## 2024-01-28 ENCOUNTER — Ambulatory Visit: Payer: Self-pay | Admitting: Physician Assistant

## 2024-01-29 MED ORDER — FLUCONAZOLE 150 MG PO TABS: 150.0000 mg | ORAL_TABLET | Freq: Once | ORAL | 0 refills | Status: AC

## 2024-02-29 ENCOUNTER — Encounter: Payer: Self-pay | Admitting: Obstetrics and Gynecology
